# Patient Record
Sex: Female | Born: 1990 | Race: Black or African American | Hispanic: No | Marital: Single | Smoking: Never smoker
Health system: Southern US, Community
[De-identification: ages and names within clinical notes are randomized; demographics above are authoritative.]

## PROBLEM LIST (undated history)

## (undated) DIAGNOSIS — D573 Sickle-cell trait: Secondary | ICD-10-CM

## (undated) DIAGNOSIS — I10 Essential (primary) hypertension: Secondary | ICD-10-CM

## (undated) DIAGNOSIS — G4730 Sleep apnea, unspecified: Secondary | ICD-10-CM

## (undated) HISTORY — DX: Sleep apnea, unspecified: G47.30

## (undated) HISTORY — DX: Sickle-cell trait: D57.3

---

## 2008-07-17 ENCOUNTER — Emergency Department: Payer: Self-pay | Admitting: Emergency Medicine

## 2008-08-07 ENCOUNTER — Emergency Department: Payer: Self-pay | Admitting: Emergency Medicine

## 2009-02-09 ENCOUNTER — Emergency Department: Payer: Self-pay | Admitting: Emergency Medicine

## 2009-04-03 ENCOUNTER — Emergency Department: Payer: Self-pay | Admitting: Emergency Medicine

## 2009-04-28 ENCOUNTER — Encounter: Payer: Self-pay | Admitting: Family Medicine

## 2009-05-13 ENCOUNTER — Encounter: Payer: Self-pay | Admitting: Pediatric Cardiology

## 2009-06-10 ENCOUNTER — Encounter: Payer: Self-pay | Admitting: Pediatric Cardiology

## 2009-06-15 ENCOUNTER — Observation Stay: Payer: Self-pay

## 2009-08-03 ENCOUNTER — Observation Stay: Payer: Self-pay

## 2009-08-13 ENCOUNTER — Inpatient Hospital Stay: Payer: Self-pay

## 2010-03-10 ENCOUNTER — Emergency Department: Payer: Self-pay | Admitting: Emergency Medicine

## 2010-05-03 ENCOUNTER — Emergency Department: Payer: Self-pay | Admitting: Emergency Medicine

## 2010-05-08 ENCOUNTER — Ambulatory Visit: Payer: Self-pay | Admitting: Family Medicine

## 2010-06-08 ENCOUNTER — Encounter: Payer: Self-pay | Admitting: Obstetrics and Gynecology

## 2010-07-13 ENCOUNTER — Encounter: Payer: Self-pay | Admitting: Obstetrics and Gynecology

## 2010-08-08 ENCOUNTER — Observation Stay: Payer: Self-pay

## 2010-08-24 ENCOUNTER — Encounter: Payer: Self-pay | Admitting: Maternal and Fetal Medicine

## 2010-09-14 ENCOUNTER — Observation Stay: Payer: Self-pay | Admitting: Obstetrics and Gynecology

## 2010-11-01 ENCOUNTER — Emergency Department: Payer: Self-pay | Admitting: Emergency Medicine

## 2011-03-14 ENCOUNTER — Emergency Department: Payer: Self-pay | Admitting: Unknown Physician Specialty

## 2011-04-02 ENCOUNTER — Emergency Department: Payer: Self-pay | Admitting: Emergency Medicine

## 2011-04-19 ENCOUNTER — Emergency Department: Payer: Self-pay | Admitting: Unknown Physician Specialty

## 2011-04-22 ENCOUNTER — Emergency Department: Payer: Self-pay | Admitting: Emergency Medicine

## 2011-07-06 ENCOUNTER — Emergency Department: Payer: Self-pay | Admitting: Emergency Medicine

## 2011-07-07 ENCOUNTER — Encounter: Payer: Self-pay | Admitting: Primary Care

## 2011-07-08 ENCOUNTER — Encounter: Payer: Self-pay | Admitting: Primary Care

## 2011-07-30 ENCOUNTER — Emergency Department: Payer: Self-pay

## 2011-08-07 ENCOUNTER — Encounter: Payer: Self-pay | Admitting: Primary Care

## 2011-09-01 ENCOUNTER — Emergency Department: Payer: Self-pay | Admitting: Emergency Medicine

## 2011-12-04 ENCOUNTER — Emergency Department: Payer: Self-pay | Admitting: Emergency Medicine

## 2012-08-01 ENCOUNTER — Emergency Department: Payer: Self-pay | Admitting: Emergency Medicine

## 2012-08-06 LAB — WOUND CULTURE

## 2012-08-18 ENCOUNTER — Emergency Department: Payer: Self-pay | Admitting: Emergency Medicine

## 2012-12-19 ENCOUNTER — Emergency Department: Payer: Self-pay | Admitting: Emergency Medicine

## 2012-12-19 LAB — LIPASE, BLOOD: Lipase: 126 U/L (ref 73–393)

## 2012-12-19 LAB — URINALYSIS, COMPLETE
Bacteria: NONE SEEN
Leukocyte Esterase: NEGATIVE
Nitrite: NEGATIVE
Ph: 8 (ref 4.5–8.0)
RBC,UR: 1 /HPF (ref 0–5)
Squamous Epithelial: 2
WBC UR: 2 /HPF (ref 0–5)

## 2012-12-19 LAB — COMPREHENSIVE METABOLIC PANEL
Albumin: 3.3 g/dL — ABNORMAL LOW (ref 3.4–5.0)
Alkaline Phosphatase: 130 U/L (ref 50–136)
Bilirubin,Total: 0.1 mg/dL — ABNORMAL LOW (ref 0.2–1.0)
Calcium, Total: 8.6 mg/dL (ref 8.5–10.1)
Chloride: 108 mmol/L — ABNORMAL HIGH (ref 98–107)
Co2: 26 mmol/L (ref 21–32)
Creatinine: 0.77 mg/dL (ref 0.60–1.30)
EGFR (Non-African Amer.): >60
Glucose: 100 mg/dL — ABNORMAL HIGH (ref 65–99)
Potassium: 3.8 mmol/L (ref 3.5–5.1)
SGOT(AST): 21 U/L (ref 15–37)
Sodium: 139 mmol/L (ref 136–145)
Total Protein: 7.7 g/dL (ref 6.4–8.2)

## 2012-12-19 LAB — CBC
HGB: 10.9 g/dL — ABNORMAL LOW (ref 12.0–16.0)
MCH: 25 pg — ABNORMAL LOW (ref 26.0–34.0)
MCHC: 33.3 g/dL (ref 32.0–36.0)
MCV: 75 fL — ABNORMAL LOW (ref 80–100)
RBC: 4.34 10*6/uL (ref 3.80–5.20)
RDW: 17.3 % — ABNORMAL HIGH (ref 11.5–14.5)
WBC: 11.6 10*3/uL — ABNORMAL HIGH (ref 3.6–11.0)

## 2013-02-19 ENCOUNTER — Emergency Department: Payer: Self-pay | Admitting: Emergency Medicine

## 2013-02-19 LAB — URINALYSIS, COMPLETE
Bacteria: NONE SEEN
Bilirubin,UR: NEGATIVE
Glucose,UR: NEGATIVE mg/dL (ref 0–75)
Ketone: NEGATIVE
Nitrite: NEGATIVE
Specific Gravity: 1.013 (ref 1.003–1.030)
WBC UR: 20 /HPF (ref 0–5)

## 2013-02-19 LAB — COMPREHENSIVE METABOLIC PANEL
Albumin: 3 g/dL — ABNORMAL LOW (ref 3.4–5.0)
Alkaline Phosphatase: 109 U/L (ref 50–136)
Anion Gap: 4 — ABNORMAL LOW (ref 7–16)
BUN: 8 mg/dL (ref 7–18)
Bilirubin,Total: 0.2 mg/dL (ref 0.2–1.0)
Creatinine: 0.76 mg/dL (ref 0.60–1.30)
EGFR (Non-African Amer.): >60
Osmolality: 280 (ref 275–301)
Potassium: 3.8 mmol/L (ref 3.5–5.1)
SGOT(AST): 19 U/L (ref 15–37)
Sodium: 141 mmol/L (ref 136–145)

## 2013-02-19 LAB — CBC
HCT: 29.8 % — ABNORMAL LOW (ref 35.0–47.0)
HGB: 9.9 g/dL — ABNORMAL LOW (ref 12.0–16.0)
MCH: 25.1 pg — ABNORMAL LOW (ref 26.0–34.0)
MCV: 75 fL — ABNORMAL LOW (ref 80–100)
Platelet: 321 10*3/uL (ref 150–440)
RDW: 18.5 % — ABNORMAL HIGH (ref 11.5–14.5)

## 2013-05-19 ENCOUNTER — Emergency Department: Payer: Self-pay | Admitting: Emergency Medicine

## 2013-07-17 ENCOUNTER — Emergency Department: Payer: Self-pay | Admitting: Emergency Medicine

## 2013-07-17 LAB — URINALYSIS, COMPLETE
Bacteria: NONE SEEN
Glucose,UR: NEGATIVE mg/dL (ref 0–75)
Protein: NEGATIVE
RBC,UR: 17 /HPF (ref 0–5)
Specific Gravity: 1.013 (ref 1.003–1.030)
Squamous Epithelial: 14
WBC UR: 6 /HPF (ref 0–5)

## 2013-07-17 LAB — COMPREHENSIVE METABOLIC PANEL
Albumin: 3.3 g/dL — ABNORMAL LOW (ref 3.4–5.0)
Anion Gap: 4 — ABNORMAL LOW (ref 7–16)
Bilirubin,Total: 0.3 mg/dL (ref 0.2–1.0)
Chloride: 108 mmol/L — ABNORMAL HIGH (ref 98–107)
Co2: 27 mmol/L (ref 21–32)
Creatinine: 0.89 mg/dL (ref 0.60–1.30)
EGFR (African American): >60
EGFR (Non-African Amer.): >60
Glucose: 88 mg/dL (ref 65–99)
Potassium: 4 mmol/L (ref 3.5–5.1)
SGPT (ALT): 18 U/L (ref 12–78)
Sodium: 139 mmol/L (ref 136–145)

## 2013-07-17 LAB — CBC
HCT: 35.1 % (ref 35.0–47.0)
HGB: 11.5 g/dL — ABNORMAL LOW (ref 12.0–16.0)
MCH: 24 pg — ABNORMAL LOW (ref 26.0–34.0)
MCHC: 32.8 g/dL (ref 32.0–36.0)
Platelet: 315 10*3/uL (ref 150–440)
RBC: 4.81 10*6/uL (ref 3.80–5.20)
RDW: 17.7 % — ABNORMAL HIGH (ref 11.5–14.5)

## 2013-07-17 LAB — PREGNANCY, URINE: Pregnancy Test, Urine: NEGATIVE m[IU]/mL

## 2013-07-27 ENCOUNTER — Emergency Department: Payer: Self-pay | Admitting: Internal Medicine

## 2013-10-08 ENCOUNTER — Emergency Department: Payer: Self-pay | Admitting: Emergency Medicine

## 2013-10-09 LAB — URINALYSIS, COMPLETE
Bilirubin,UR: NEGATIVE
Glucose,UR: NEGATIVE mg/dL (ref 0–75)
KETONE: NEGATIVE
Nitrite: NEGATIVE
PH: 6 (ref 4.5–8.0)
Protein: NEGATIVE
SPECIFIC GRAVITY: 1.013 (ref 1.003–1.030)
Squamous Epithelial: 13
WBC UR: 4 /HPF (ref 0–5)

## 2013-10-09 LAB — WET PREP, GENITAL

## 2013-10-09 LAB — GC/CHLAMYDIA PROBE AMP

## 2013-10-10 LAB — URINE CULTURE

## 2013-10-17 ENCOUNTER — Emergency Department: Payer: Self-pay | Admitting: Emergency Medicine

## 2013-10-18 LAB — URINALYSIS, COMPLETE
BILIRUBIN, UR: NEGATIVE
BLOOD: NEGATIVE
Glucose,UR: NEGATIVE mg/dL (ref 0–75)
Ketone: NEGATIVE
NITRITE: NEGATIVE
PROTEIN: NEGATIVE
Ph: 6 (ref 4.5–8.0)
RBC,UR: 3 /HPF (ref 0–5)
SPECIFIC GRAVITY: 1.015 (ref 1.003–1.030)
Squamous Epithelial: 7

## 2013-11-21 ENCOUNTER — Ambulatory Visit: Payer: Self-pay | Admitting: Primary Care

## 2014-06-21 IMAGING — US ABDOMEN ULTRASOUND
1 series · 14 of 25 positions shown · non-contrast
Comparison: None.

CLINICAL DATA: Abdominal pain

EXAM:
ULTRASOUND ABDOMEN COMPLETE

[Series 1: abdomen ultrasound · 0.23mm/px · 14 of 84 slices shown]
[im 1/84]
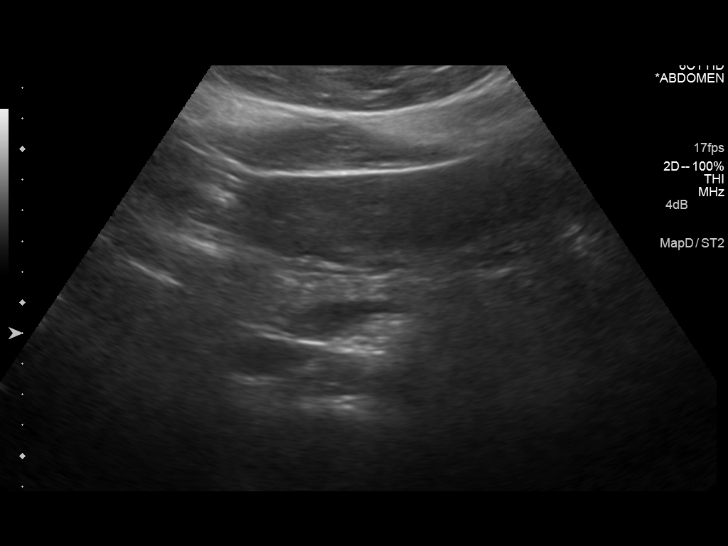
[im 7/84]
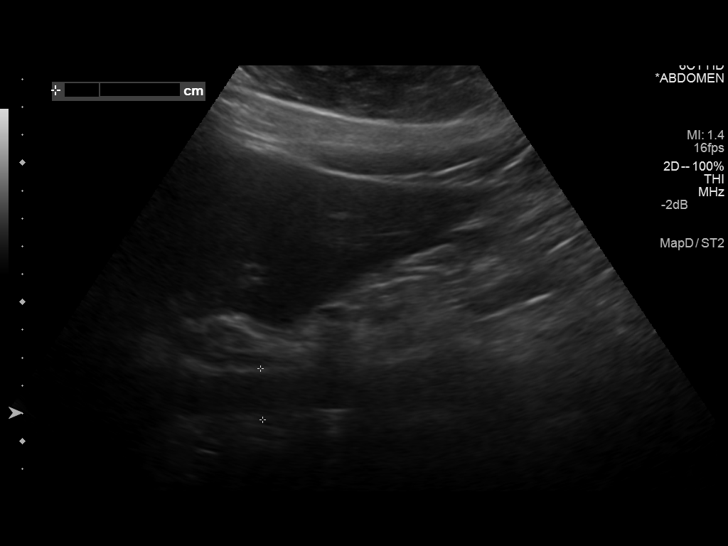
[im 14/84]
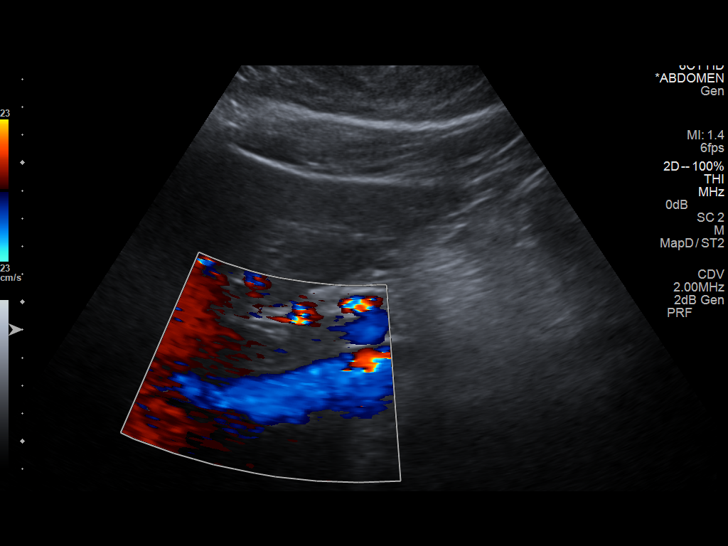
[im 21/84]
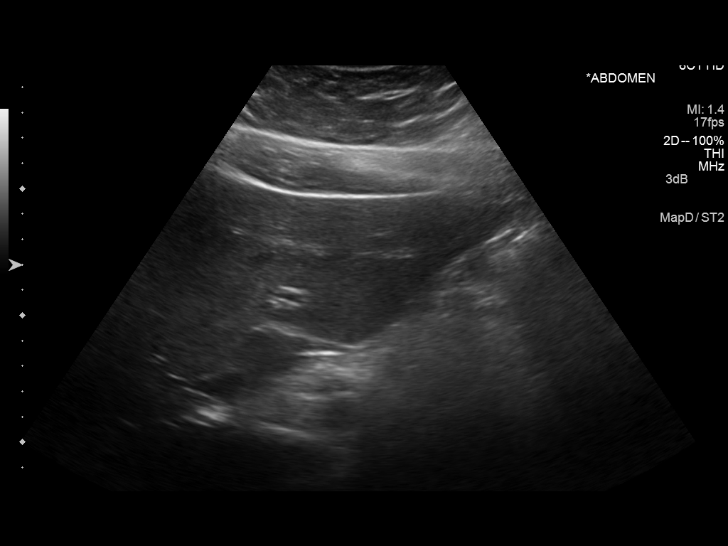
[im 28/84]
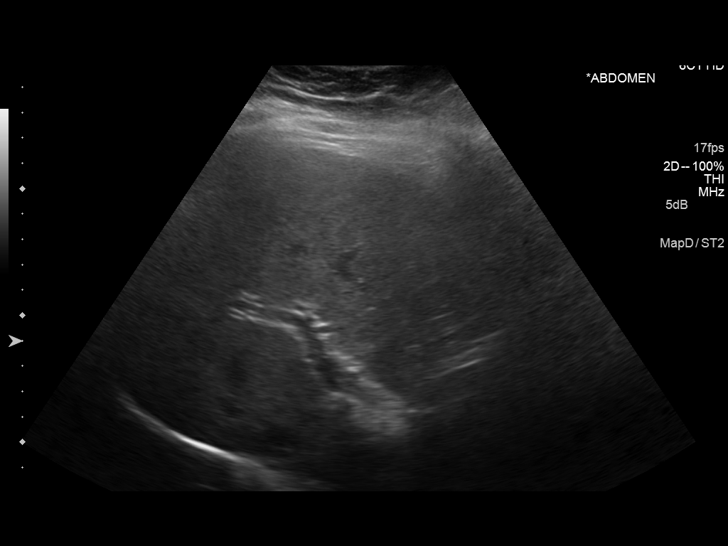
[im 32/84]
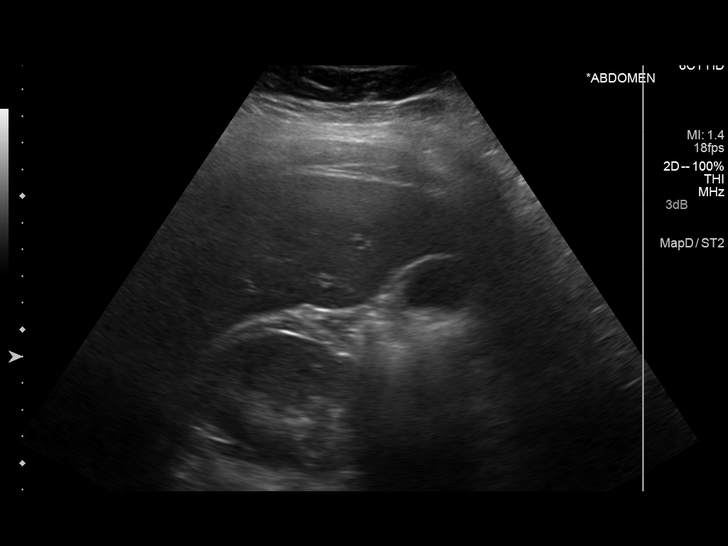
[im 39/84]
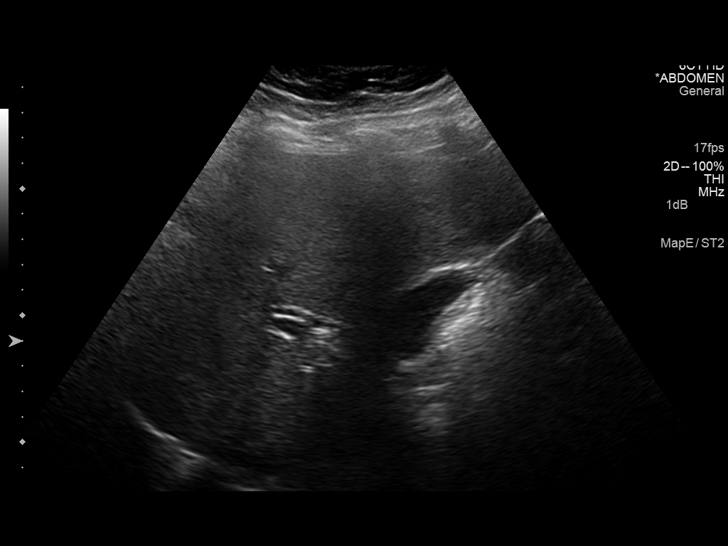
[im 45/84]
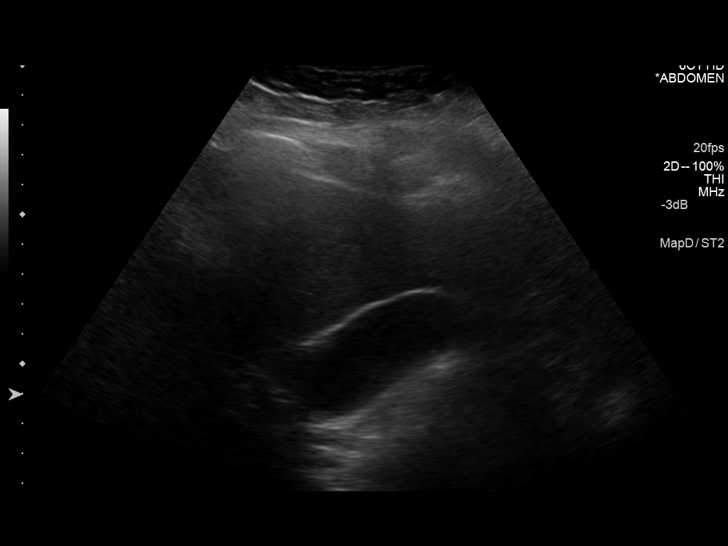
[im 52/84]
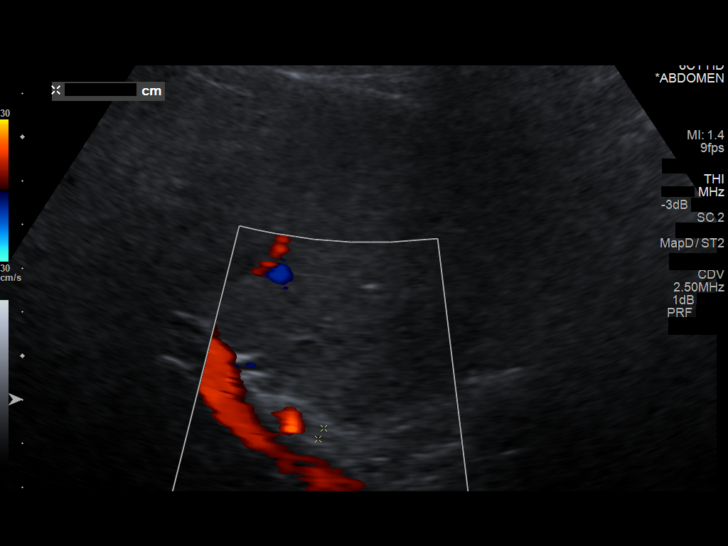
[im 56/84]
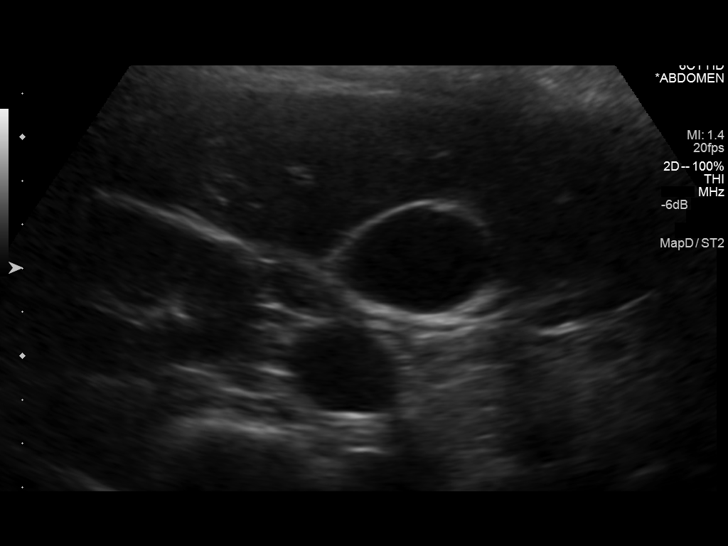
[im 63/84]
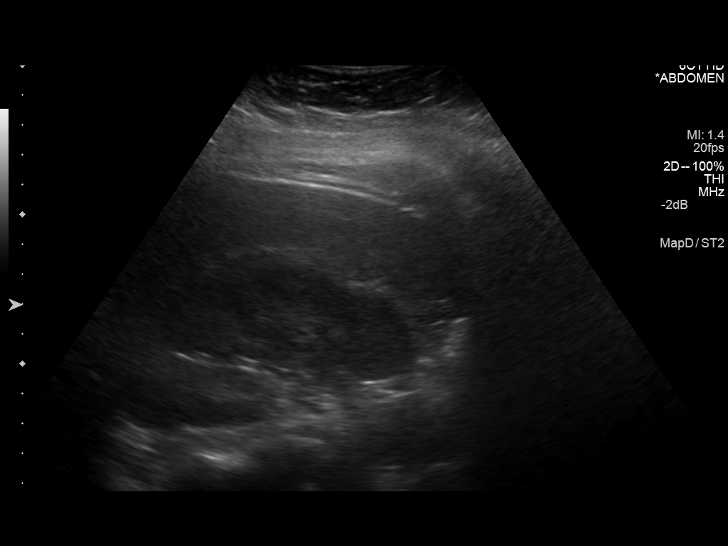
[im 70/84]
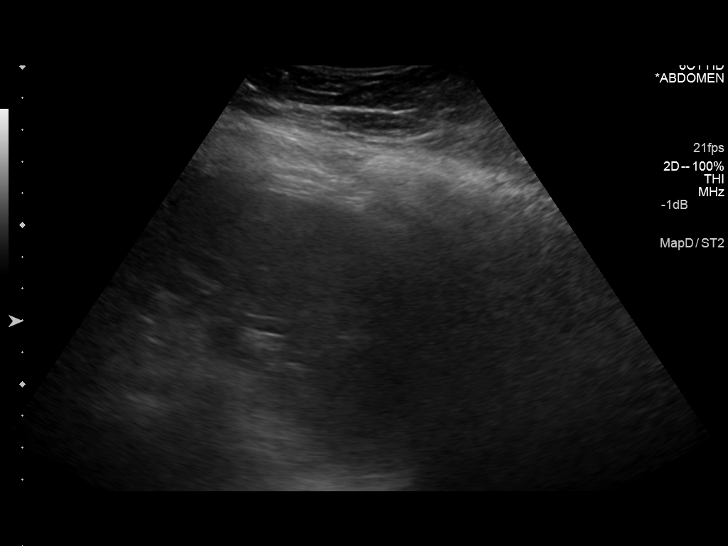
[im 77/84]
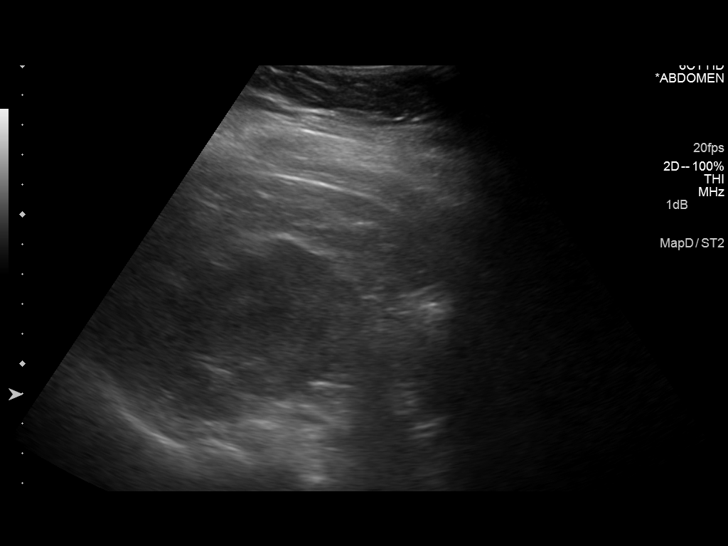
[im 84/84]
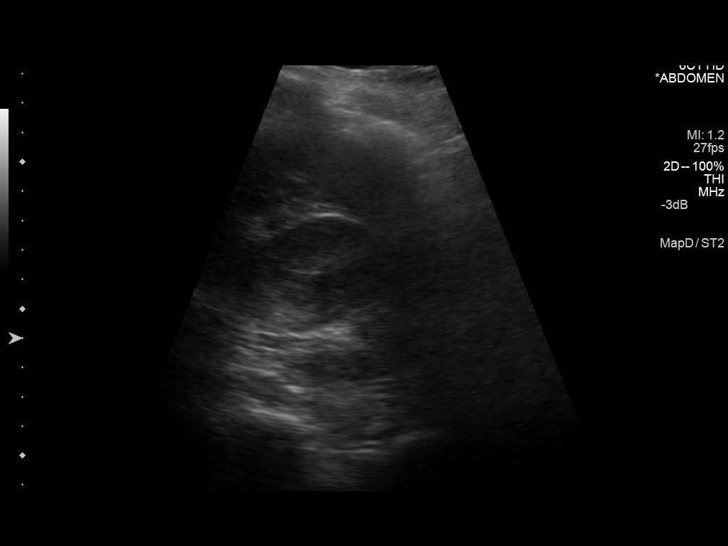

[14 of 25 positions shown; findings below may reference images not displayed]

FINDINGS: Gallbladder:

No gallstones or wall thickening visualized. There is no
pericholecystic fluid. No sonographic Murphy sign noted.

Common bile duct:

Diameter: 3 mm. There is no intrahepatic, common hepatic, or common
bile duct dilatation.

Liver:

No focal lesion identified.  Liver echogenicity is mildly increased.

IVC:

No abnormality visualized.

Pancreas:

No mass or inflammatory focus.

Spleen:

Size and appearance within normal limits.

Right Kidney:

Length: 12.0 cm. Echogenicity within normal limits. No mass or
hydronephrosis visualized.

Left Kidney:

Length: 12.0 cm. Echogenicity within normal limits. No mass or
hydronephrosis visualized.

Abdominal aorta:

No aneurysm visualized.

Other findings:

No demonstrable ascites.
IMPRESSION: Increased liver echogenicity consistent with fatty change. While no
focal liver lesions are identified, it must be cautioned that the
sensitivity of ultrasound for focal liver lesions is diminished
given fatty change. Study otherwise unremarkable.

## 2014-06-27 ENCOUNTER — Emergency Department: Payer: Self-pay | Admitting: Emergency Medicine

## 2014-06-27 LAB — COMPREHENSIVE METABOLIC PANEL
ALBUMIN: 3.2 g/dL — AB (ref 3.4–5.0)
Alkaline Phosphatase: 124 U/L — ABNORMAL HIGH
Anion Gap: 7 (ref 7–16)
BUN: 11 mg/dL (ref 7–18)
Bilirubin,Total: 0.2 mg/dL (ref 0.2–1.0)
CALCIUM: 8.7 mg/dL (ref 8.5–10.1)
CO2: 25 mmol/L (ref 21–32)
Chloride: 105 mmol/L (ref 98–107)
Creatinine: 0.81 mg/dL (ref 0.60–1.30)
EGFR (African American): >60
GLUCOSE: 115 mg/dL — AB (ref 65–99)
Osmolality: 274 (ref 275–301)
Potassium: 3.8 mmol/L (ref 3.5–5.1)
SGOT(AST): 15 U/L (ref 15–37)
SGPT (ALT): 18 U/L
Sodium: 137 mmol/L (ref 136–145)
TOTAL PROTEIN: 7.7 g/dL (ref 6.4–8.2)

## 2014-06-27 LAB — CBC WITH DIFFERENTIAL/PLATELET
BASOS ABS: 0.1 10*3/uL (ref 0.0–0.1)
BASOS PCT: 1.3 %
EOS ABS: 0.1 10*3/uL (ref 0.0–0.7)
EOS PCT: 1.1 %
HCT: 37.2 % (ref 35.0–47.0)
HGB: 11.6 g/dL — AB (ref 12.0–16.0)
LYMPHS ABS: 2.7 10*3/uL (ref 1.0–3.6)
Lymphocyte %: 33.5 %
MCH: 23.3 pg — ABNORMAL LOW (ref 26.0–34.0)
MCHC: 31.1 g/dL — ABNORMAL LOW (ref 32.0–36.0)
MCV: 75 fL — ABNORMAL LOW (ref 80–100)
Monocyte #: 0.4 x10 3/mm (ref 0.2–0.9)
Monocyte %: 5.5 %
Neutrophil #: 4.7 10*3/uL (ref 1.4–6.5)
Neutrophil %: 58.6 %
PLATELETS: 313 10*3/uL (ref 150–440)
RBC: 4.98 10*6/uL (ref 3.80–5.20)
RDW: 17.9 % — ABNORMAL HIGH (ref 11.5–14.5)
WBC: 8.1 10*3/uL (ref 3.6–11.0)

## 2014-07-21 ENCOUNTER — Emergency Department: Payer: Self-pay | Admitting: Emergency Medicine

## 2014-11-09 ENCOUNTER — Emergency Department: Payer: Self-pay | Admitting: Emergency Medicine

## 2015-02-01 ENCOUNTER — Encounter: Payer: Self-pay | Admitting: Emergency Medicine

## 2015-02-01 ENCOUNTER — Emergency Department
Admission: EM | Admit: 2015-02-01 | Discharge: 2015-02-01 | Disposition: A | Discharge status: Home / Self Care | Payer: Medicaid Other | Attending: Emergency Medicine | Admitting: Emergency Medicine

## 2015-02-01 DIAGNOSIS — N390 Urinary tract infection, site not specified: Secondary | ICD-10-CM | POA: Insufficient documentation

## 2015-02-01 DIAGNOSIS — I10 Essential (primary) hypertension: Secondary | ICD-10-CM | POA: Insufficient documentation

## 2015-02-01 DIAGNOSIS — B373 Candidiasis of vulva and vagina: Secondary | ICD-10-CM | POA: Diagnosis not present

## 2015-02-01 DIAGNOSIS — Z3202 Encounter for pregnancy test, result negative: Secondary | ICD-10-CM | POA: Diagnosis not present

## 2015-02-01 DIAGNOSIS — B3731 Acute candidiasis of vulva and vagina: Secondary | ICD-10-CM

## 2015-02-01 DIAGNOSIS — L299 Pruritus, unspecified: Secondary | ICD-10-CM | POA: Diagnosis present

## 2015-02-01 HISTORY — DX: Essential (primary) hypertension: I10

## 2015-02-01 LAB — URINALYSIS COMPLETE WITH MICROSCOPIC (ARMC ONLY)
Bilirubin Urine: NEGATIVE
Glucose, UA: NEGATIVE mg/dL
HGB URINE DIPSTICK: NEGATIVE
Ketones, ur: NEGATIVE mg/dL
Nitrite: NEGATIVE
PH: 7 (ref 5.0–8.0)
Protein, ur: NEGATIVE mg/dL
Specific Gravity, Urine: 1.012 (ref 1.005–1.030)

## 2015-02-01 LAB — WET PREP, GENITAL
Clue Cells Wet Prep HPF POC: NONE SEEN
Trich, Wet Prep: NONE SEEN
YEAST WET PREP: NONE SEEN

## 2015-02-01 LAB — CHLAMYDIA/NGC RT PCR (ARMC ONLY)
Chlamydia Tr: NOT DETECTED
N GONORRHOEAE: NOT DETECTED

## 2015-02-01 LAB — POCT PREGNANCY, URINE: PREG TEST UR: NEGATIVE

## 2015-02-01 MED ORDER — NITROFURANTOIN MONOHYD MACRO 100 MG PO CAPS: 100.0000 mg | ORAL_CAPSULE | Freq: Two times a day (BID) | ORAL | Status: DC

## 2015-02-01 MED ORDER — FLUCONAZOLE 150 MG PO TABS: 150.0000 mg | ORAL_TABLET | Freq: Once | ORAL | Status: DC

## 2015-02-01 NOTE — Discharge Instructions (Signed)
Asymptomatic Bacteriuria Asymptomatic bacteriuria is the presence of a large number of bacteria in your urine without the usual symptoms of burning or frequent urination. The following conditions increase the risk of asymptomatic bacteriuria:  Diabetes mellitus.  Advanced age.  Pregnancy in the first trimester.  Kidney stones.  Kidney transplants.  Leaky kidney tube valve in young children (reflux). Treatment for this condition is not needed in most people and can lead to other problems such as too much yeast and growth of resistant bacteria. However, some people, such as pregnant women, do need treatment to prevent kidney infection. Asymptomatic bacteriuria in pregnancy is also associated with fetal growth restriction, premature labor, and newborn death. HOME CARE INSTRUCTIONS Monitor your condition for any changes. The following actions may help to relieve any discomfort you are feeling:  Drink enough water and fluids to keep your urine clear or pale yellow. Go to the bathroom more often to keep your bladder empty.  Keep the area around your vagina and rectum clean. Wipe yourself from front to back after urinating. SEEK IMMEDIATE MEDICAL CARE IF:  You develop signs of an infection such as:  Burning with urination.  Frequency of voiding.  Back pain.  Fever.  You have blood in the urine.  You develop a fever. MAKE SURE YOU:  Understand these instructions.  Will watch your condition.  Will get help right away if you are not doing well or get worse. Document Released: 08/23/2005 Document Revised: 01/07/2014 Document Reviewed: 02/12/2013 [facility] Patient Information 2015 [place], [place]. This information is not intended to replace advice given to you by your health care provider. Make sure you discuss any questions you have with your health care provider.  Candidal Vulvovaginitis Candidal vulvovaginitis is an infection of the vagina and vulva. The vulva is the skin around  the opening of the vagina. This may cause itching and discomfort in and around the vagina.  HOME CARE  Only take medicine as told by your doctor.  Do not have sex (intercourse) until the infection is healed or as told by your doctor.  Practice safe sex.  Tell your sex partner about your infection.  Do not douche or use tampons.  Wear cotton underwear. Do not wear tight pants or panty hose.  Eat yogurt. This may help treat and prevent yeast infections. GET HELP RIGHT AWAY IF:   You have a fever.  Your problems get worse during treatment or do not get better in 3 days.  You have discomfort, irritation, or itching in your vagina or vulva area.  You have pain after sex.  You start to get belly (abdominal) pain. MAKE SURE YOU:  Understand these instructions.  Will watch your condition.  Will get help right away if you are not doing well or get worse. Document Released: 11/19/2008 Document Revised: 08/28/2013 Document Reviewed: 11/19/2008 [facility] Patient Information 2015 [place], [place]. This information is not intended to replace advice given to you by your health care provider. Make sure you discuss any questions you have with your health care provider.  Take the prescription meds as directed.  Follow-up with your provider for continued symptoms.

## 2015-02-01 NOTE — ED Provider Notes (Signed)
[facility] Emergency Department Provider Note ____________________________________________  Time seen: 1227  I have reviewed the triage vital signs and the nursing notes.  HISTORY  Chief Complaint Vaginal Itching  HPI Vanessa Conner is a 24 y.o. female who reports to the ED with onset of vaginal is itching status post intercourse yesterday. She denies any significant vaginal discharge, dysuria, or hematuria. He does use topical powder and a vinegar wash without resolution of her symptoms.She denies fever, chills, sweats, nausea, vomiting or diarrhea.  Past Medical History  Diagnosis Date  . Hypertension    There are no active problems to display for this patient.  No past surgical history on file.  Current Outpatient Rx  Name  Route  Sig  Dispense  Refill  . fluconazole (DIFLUCAN) 150 MG tablet   Oral   Take 1 tablet (150 mg total) by mouth once.   2 tablet   0     Dose 1 tab now.  Then repeat in 1 week after antib ...   . nitrofurantoin, macrocrystal-monohydrate, (MACROBID) 100 MG capsule   Oral   Take 1 capsule (100 mg total) by mouth 2 (two) times daily.   14 capsule   0    Allergies Review of patient's allergies indicates no known allergies.  No family history on file.  Social History History  Substance Use Topics  . Smoking status: Never Smoker   . Smokeless tobacco: Not on file  . Alcohol Use: 0.6 oz/week    1 Standard drinks or equivalent per week   Review of Systems  Constitutional: Negative for fever. Eyes: Negative for visual changes. ENT: Negative for sore throat. Cardiovascular: Negative for chest pain. Respiratory: Negative for shortness of breath. Gastrointestinal: Negative for abdominal pain, vomiting and diarrhea. Genitourinary: Negative for dysuria. Positive for vulvar itching. Positive for discharge.  Musculoskeletal: Negative for back pain. Skin: Negative for rash. Neurological: Negative for headaches, focal  weakness or numbness. ____________________________________________  PHYSICAL EXAM:  VITAL SIGNS: ED Triage Vitals  Enc Vitals Group     BP 02/01/15 1135 139/83 mmHg     Pulse Rate 02/01/15 1135 84     Resp 02/01/15 1135 18     Temp 02/01/15 1135 98.1 F (36.7 C)     Temp Source 02/01/15 1135 Oral     SpO2 02/01/15 1135 100 %     Weight 02/01/15 1135 342 lb (155.13 kg)     Height 02/01/15 1135  (1.676 m)     Head Cir --      Peak Flow --      Pain Score 02/01/15 1136 0     Pain Loc --      Pain Edu? --      Excl. in GC? --    Constitutional: Alert and oriented. Well appearing and in no distress. Obese female appearing stated age.  HEENT: Normocephalic and atraumatic. Conjunctivae are normal. PERRL. Normal extraocular movements. Respiratory: Normal respiratory effort Musculoskeletal: Nontender with normal range of motion in all extremities. Genitourinary: Normal external exam. Vaginal exam shows moderate white discharge. Cervix not located due to body habitus and discomfort with prolonged speculum use.  Neurologic:  Normal speech and language. No gross focal neurologic deficits are appreciated. Skin:  Skin is warm, dry and intact. No rash noted. Psychiatric: Mood and affect are normal. Patient exhibits appropriate insight and judgment. ____________________________________________    LABS  Labs Reviewed  WET PREP, GENITAL - Abnormal; Notable for the following:  WBC, Wet Prep HPF POC MODERATE (*)    All other components within normal limits  URINALYSIS COMPLETEWITH MICROSCOPIC ([facility] ONLY) - Abnormal; Notable for the following:    Color, Urine YELLOW (*)    APPearance CLOUDY (*)    Leukocytes, UA 3+ (*)    Bacteria, UA RARE (*)    Squamous Epithelial / LPF 6-30 (*)    All other components within normal limits  CHLAMYDIA/NGC RT PCR ([facility] ONLY)  POC URINE PREG, ED  POCT PREGNANCY, URINE  __________________________________________  INITIAL IMPRESSION /  ASSESSMENT AND PLAN / ED COURSE  Uncomplicated UTI and vulvovaginitis likely due to candida.  Treatment with Macrobid and Diflucan.  Follow-up with primary care provider for continued symptoms. ____________________________________________  FINAL CLINICAL IMPRESSION(S) / ED DIAGNOSES  Final diagnoses:  UTI (lower urinary tract infection)  Vulvar candidiasis     Lissa HoardJenise V Bacon Scotty Pinder, PA-C 02/01/15 1513  Minna AntisKevin Paduchowski, MD 02/02/15 1248

## 2015-02-01 NOTE — ED Notes (Signed)
No discharge

## 2015-09-05 ENCOUNTER — Emergency Department: Payer: Medicaid Other

## 2015-09-05 ENCOUNTER — Emergency Department
Admission: EM | Admit: 2015-09-05 | Discharge: 2015-09-05 | Disposition: A | Discharge status: Home / Self Care | Payer: Medicaid Other | Attending: Emergency Medicine | Admitting: Emergency Medicine

## 2015-09-05 DIAGNOSIS — K5900 Constipation, unspecified: Secondary | ICD-10-CM | POA: Diagnosis not present

## 2015-09-05 DIAGNOSIS — I10 Essential (primary) hypertension: Secondary | ICD-10-CM | POA: Insufficient documentation

## 2015-09-05 DIAGNOSIS — Z792 Long term (current) use of antibiotics: Secondary | ICD-10-CM | POA: Diagnosis not present

## 2015-09-05 DIAGNOSIS — R1030 Lower abdominal pain, unspecified: Secondary | ICD-10-CM | POA: Diagnosis present

## 2015-09-05 DIAGNOSIS — Z3202 Encounter for pregnancy test, result negative: Secondary | ICD-10-CM | POA: Diagnosis not present

## 2015-09-05 LAB — URINALYSIS COMPLETE WITH MICROSCOPIC (ARMC ONLY)
BILIRUBIN URINE: NEGATIVE
GLUCOSE, UA: NEGATIVE mg/dL
Hgb urine dipstick: NEGATIVE
Ketones, ur: NEGATIVE mg/dL
Leukocytes, UA: NEGATIVE
Nitrite: NEGATIVE
Protein, ur: NEGATIVE mg/dL
Specific Gravity, Urine: 1.014 (ref 1.005–1.030)
pH: 6 (ref 5.0–8.0)

## 2015-09-05 LAB — POCT PREGNANCY, URINE: PREG TEST UR: NEGATIVE

## 2015-09-05 MED ORDER — LACTULOSE 10 GM/15ML PO SOLN: 20.0000 g | Freq: Every day | ORAL | Status: DC | PRN

## 2015-09-05 MED ORDER — KETOROLAC TROMETHAMINE 30 MG/ML IJ SOLN
30.0000 mg | Freq: Once | INTRAMUSCULAR | Status: AC
  Administered 2015-09-05: 30 mg via INTRAVENOUS
  Filled 2015-09-05: qty 1

## 2015-09-05 NOTE — Discharge Instructions (Signed)
1. You may take laxative as needed (Lactulose). 2. Drink plenty of fluids daily. 3. Return to the ER for worsening symptoms, persistent vomiting, difficulty breathing or other concerns.  Abdominal Pain, Adult Many things can cause abdominal pain. Usually, abdominal pain is not caused by a disease and will improve without treatment. It can often be observed and treated at home. Your health care provider will do a physical exam and possibly order blood tests and X-rays to help determine the seriousness of your pain. However, in many cases, more time must pass before a clear cause of the pain can be found. Before that point, your health care provider may not know if you need more testing or further treatment. HOME CARE INSTRUCTIONS Monitor your abdominal pain for any changes. The following actions may help to alleviate any discomfort you are experiencing:  Only take over-the-counter or prescription medicines as directed by your health care provider.  Do not take laxatives unless directed to do so by your health care provider.  Try a clear liquid diet (broth, tea, or water) as directed by your health care provider. Slowly move to a bland diet as tolerated. SEEK MEDICAL CARE IF:  You have unexplained abdominal pain.  You have abdominal pain associated with nausea or diarrhea.  You have pain when you urinate or have a bowel movement.  You experience abdominal pain that wakes you in the night.  You have abdominal pain that is worsened or improved by eating food.  You have abdominal pain that is worsened with eating fatty foods.  You have a fever. SEEK IMMEDIATE MEDICAL CARE IF:  Your pain does not go away within 2 hours.  You keep throwing up (vomiting).  Your pain is felt only in portions of the abdomen, such as the right side or the left lower portion of the abdomen.  You pass bloody or black tarry stools. MAKE SURE YOU:  Understand these instructions.  Will watch your  condition.  Will get help right away if you are not doing well or get worse.   This information is not intended to replace advice given to you by your health care provider. Make sure you discuss any questions you have with your health care provider.   Document Released: 06/02/2005 Document Revised: 05/14/2015 Document Reviewed: 05/02/2013 Elsevier Interactive Patient Education 2016 [employer].  Constipation, Adult Constipation is when a person has fewer than three bowel movements a week, has difficulty having a bowel movement, or has stools that are dry, hard, or larger than normal. As people grow older, constipation is more common. A low-fiber diet, not taking in enough fluids, and taking certain medicines may make constipation worse.  CAUSES   Certain medicines, such as antidepressants, pain medicine, iron supplements, antacids, and water pills.   Certain diseases, such as diabetes, irritable bowel syndrome (IBS), thyroid disease, or depression.   Not drinking enough water.   Not eating enough fiber-rich foods.   Stress or travel.   Lack of physical activity or exercise.   Ignoring the urge to have a bowel movement.   Using laxatives too much.  SIGNS AND SYMPTOMS   Having fewer than three bowel movements a week.   Straining to have a bowel movement.   Having stools that are hard, dry, or larger than normal.   Feeling full or bloated.   Pain in the lower abdomen.   Not feeling relief after having a bowel movement.  DIAGNOSIS  Your health care provider will take a medical  history and perform a physical exam. Further testing may be done for severe constipation. Some tests may include:  A barium enema X-ray to examine your rectum, colon, and, sometimes, your small intestine.   A sigmoidoscopy to examine your lower colon.   A colonoscopy to examine your entire colon. TREATMENT  Treatment will depend on the severity of your constipation and what is  causing it. Some dietary treatments include drinking more fluids and eating more fiber-rich foods. Lifestyle treatments may include regular exercise. If these diet and lifestyle recommendations do not help, your health care provider may recommend taking over-the-counter laxative medicines to help you have bowel movements. Prescription medicines may be prescribed if over-the-counter medicines do not work.  HOME CARE INSTRUCTIONS   Eat foods that have a lot of fiber, such as fruits, vegetables, whole grains, and beans.  Limit foods high in fat and processed sugars, such as french fries, hamburgers, cookies, candies, and soda.   A fiber supplement may be added to your diet if you cannot get enough fiber from foods.   Drink enough fluids to keep your urine clear or pale yellow.   Exercise regularly or as directed by your health care provider.   Go to the restroom when you have the urge to go. Do not hold it.   Only take over-the-counter or prescription medicines as directed by your health care provider. Do not take other medicines for constipation without talking to your health care provider first.  SEEK IMMEDIATE MEDICAL CARE IF:   You have bright red blood in your stool.   Your constipation lasts for more than 4 days or gets worse.   You have abdominal or rectal pain.   You have thin, pencil-like stools.   You have unexplained weight loss. MAKE SURE YOU:   Understand these instructions.  Will watch your condition.  Will get help right away if you are not doing well or get worse.   This information is not intended to replace advice given to you by your health care provider. Make sure you discuss any questions you have with your health care provider.   Document Released: 05/21/2004 Document Revised: 09/13/2014 Document Reviewed: 06/04/2013 Elsevier Interactive Patient Education [employer] Elsevier Inc.

## 2015-09-05 NOTE — ED Provider Notes (Signed)
[facility] Emergency Department Provider Note  ____________________________________________  Time seen: Approximately 4:09 AM  I have reviewed the triage vital signs and the nursing notes.   HISTORY  Chief Complaint Abdominal Pain    HPI Vanessa Conner is a 24 y.o. female who presents to the ED from home with a chief complaint of abdominal pain. Patient complains of a 2 day history of low abdominal pain. Describes sharp, nonradiating pain in her central low abdomen. Thought she had a UTI or she needed to drink some water. Denies associated symptoms of fever, chills, chest pain, shortness of breath, nausea, vomiting, diarrhea, dysuria. Does complain of constipation. She usually has a bowel movement daily but has not had one for 2 days. Denies vaginal discharge or bleeding. Denies STD exposure. Denies prior history of ovarian cysts, endometriosis or nephrolithiasis. Nothing makes her symptoms better or worse. She was unable to sleep tonight secondary to discomfort and "wanted to be checked out".   Past Medical History  Diagnosis Date  . Hypertension     There are no active problems to display for this patient.   History reviewed. No pertinent past surgical history.  Current Outpatient Rx  Name  Route  Sig  Dispense  Refill  . fluconazole (DIFLUCAN) 150 MG tablet   Oral   Take 1 tablet (150 mg total) by mouth once.   2 tablet   0     Dose 1 tab now.  Then repeat in 1 week after antib ...   . nitrofurantoin, macrocrystal-monohydrate, (MACROBID) 100 MG capsule   Oral   Take 1 capsule (100 mg total) by mouth 2 (two) times daily.   14 capsule   0     Allergies Review of patient's allergies indicates no known allergies.  No family history on file.  Social History Social History  Substance Use Topics  . Smoking status: Never Smoker   . Smokeless tobacco: None  . Alcohol Use: No    Review of Systems Constitutional: No fever/chills Eyes:  No visual changes. ENT: No sore throat. Cardiovascular: Denies chest pain. Respiratory: Denies shortness of breath. Gastrointestinal: Positive for abdominal pain.  No nausea, no vomiting.  No diarrhea.  Positive for constipation. Genitourinary: Negative for dysuria. Musculoskeletal: Negative for back pain. Skin: Negative for rash. Neurological: Negative for headaches, focal weakness or numbness.  10-point ROS otherwise negative.  ____________________________________________   PHYSICAL EXAM:  VITAL SIGNS: ED Triage Vitals  Enc Vitals Group     BP 09/05/15 0226 148/94 mmHg     Pulse Rate 09/05/15 0226 95     Resp 09/05/15 0226 20     Temp 09/05/15 0226 98.6 F (37 C)     Temp Source 09/05/15 0226 Oral     SpO2 09/05/15 0226 100 %     Weight 09/05/15 0226 331 lb (150.141 kg)     Height 09/05/15 0226 5\' 7"  (1.702 m)     Head Cir --      Peak Flow --      Pain Score 09/05/15 0230 7     Pain Loc --      Pain Edu? --      Excl. in GC? --     Constitutional: Alert and oriented. Well appearing and in no acute distress. Eyes: Conjunctivae are normal. PERRL. EOMI. Head: Atraumatic. Nose: No congestion/rhinnorhea. Mouth/Throat: Mucous membranes are moist.  Oropharynx non-erythematous. Neck: No stridor.   Cardiovascular: Normal rate, regular rhythm. Grossly normal heart sounds.  Good  peripheral circulation. Respiratory: Normal respiratory effort.  No retractions. Lungs CTAB. Gastrointestinal: Obese. Soft and minimally tender to palpation suprapubic region without rebound or guarding. No tenderness to either lower quadrant. No distention. No abdominal bruits. No CVA tenderness. Musculoskeletal: No lower extremity tenderness nor edema.  No joint effusions. Neurologic:  Normal speech and language. No gross focal neurologic deficits are appreciated. No gait instability. Skin:  Skin is warm, dry and intact. No rash noted. Psychiatric: Mood and affect are normal. Speech and behavior are  normal.  ____________________________________________   LABS (all labs ordered are listed, but only abnormal results are displayed)  Labs Reviewed  URINALYSIS COMPLETEWITH MICROSCOPIC ([facility] ONLY) - Abnormal; Notable for the following:    Color, Urine YELLOW (*)    APPearance HAZY (*)    Bacteria, UA RARE (*)    Squamous Epithelial / LPF 6-30 (*)    All other components within normal limits  POC URINE PREG, ED  POCT PREGNANCY, URINE   ____________________________________________  EKG  None ____________________________________________  RADIOLOGY  KUB (viewed by me, interpreted per Dr. Cherly Hensen): Unremarkable bowel gas pattern; no free intra-abdominal air seen. Moderate amount of stool noted in the colon. ____________________________________________   PROCEDURES  Procedure(s) performed: None  Critical Care performed: No  ____________________________________________   INITIAL IMPRESSION / ASSESSMENT AND PLAN / ED COURSE  Pertinent labs & imaging results that were available during my care of the patient were reviewed by me and considered in my medical decision making (see chart for details).  24 year old female who presents with suprapubic discomfort and constipation 2 days. Will check urinalysis, KUB and reassess.  ----------------------------------------- 5:18 AM on 09/05/2015 -----------------------------------------  Patient is resting comfortably, testing on her cell phone. Updated patient of urinalysis and KUB results. Will place on lactulose. Very low suspicion for ovarian pathology such as torsion given that patient does not have adnexal tenderness on either side. Strict return precautions given. Patient verbalizes understanding and agrees with plan of care. ____________________________________________   FINAL CLINICAL IMPRESSION(S) / ED DIAGNOSES  Final diagnoses:  Lower abdominal pain  Constipation, unspecified constipation type      Irean Hong,  MD 09/05/15 [phone]

## 2015-09-05 NOTE — ED Notes (Signed)
Patient states lower abdominal pain two hours ago. Thought she had a UTI or that she needed to drink some water. THought she would come and get it checked.

## 2016-05-11 ENCOUNTER — Encounter: Payer: Self-pay | Admitting: Emergency Medicine

## 2016-05-11 ENCOUNTER — Emergency Department
Admission: EM | Admit: 2016-05-11 | Discharge: 2016-05-11 | Disposition: A | Discharge status: Home / Self Care | Payer: Medicaid Other | Attending: Emergency Medicine | Admitting: Emergency Medicine

## 2016-05-11 DIAGNOSIS — J020 Streptococcal pharyngitis: Secondary | ICD-10-CM

## 2016-05-11 DIAGNOSIS — J029 Acute pharyngitis, unspecified: Secondary | ICD-10-CM | POA: Diagnosis present

## 2016-05-11 DIAGNOSIS — Z79899 Other long term (current) drug therapy: Secondary | ICD-10-CM | POA: Diagnosis not present

## 2016-05-11 DIAGNOSIS — I10 Essential (primary) hypertension: Secondary | ICD-10-CM | POA: Insufficient documentation

## 2016-05-11 LAB — POCT RAPID STREP A: STREPTOCOCCUS, GROUP A SCREEN (DIRECT): POSITIVE — AB

## 2016-05-11 MED ORDER — IBUPROFEN 600 MG PO TABS: 600.0000 mg | ORAL_TABLET | Freq: Four times a day (QID) | ORAL | 0 refills | Status: DC | PRN

## 2016-05-11 MED ORDER — LIDOCAINE VISCOUS 2 % MT SOLN: 10.0000 mL | OROMUCOSAL | 0 refills | Status: DC | PRN

## 2016-05-11 MED ORDER — AMOXICILLIN 875 MG PO TABS: 875.0000 mg | ORAL_TABLET | Freq: Two times a day (BID) | ORAL | 0 refills | Status: DC

## 2016-05-11 NOTE — ED Notes (Signed)
Sore throat that began today. Pain with swallowing. Pt alert and oriented X4, active, cooperative, pt in NAD. RR even and unlabored, color WNL.

## 2016-05-11 NOTE — ED Provider Notes (Signed)
[name] Emergency Department Provider Note  ____________________________________________  Time seen: Approximately 1:45 PM  I have reviewed the triage vital signs and the nursing notes.   HISTORY  Chief Complaint Sore Throat    HPI Vanessa Conner is a 25 y.o. female , NAD, presents to the emergency department with several hour history of sore throat. Patient states she had sudden onset of sore throat and left-sided neck pain. Has not had any fevers or chills but has felt fatigued today. Has had some mild nasal congestion and runny nose but no sinus pressure, ear pain or drainage. Denies any abdominal pain, nausea, vomiting, rash. States that she and her children were sick approximately 2 weeks ago with upper respiratory infections but nothing recently. Has not taken anything over-the-counter for her symptoms at this time.   Past Medical History:  Diagnosis Date  . Hypertension     There are no active problems to display for this patient.   No past surgical history on file.  Prior to Admission medications   Medication Sig Start Date End Date Taking? Authorizing Provider  amoxicillin (AMOXIL) 875 MG tablet Take 1 tablet (875 mg total) by mouth 2 (two) times daily. 05/11/16   Montoya Brandel L Ferrell [employer], PA-C  fluconazole (DIFLUCAN) 150 MG tablet Take 1 tablet (150 mg total) by mouth once. 02/01/15   Jenise V Bacon Menshew, PA-C  ibuprofen (ADVIL,MOTRIN) 600 MG tablet Take 1 tablet (600 mg total) by mouth every 6 (six) hours as needed. 05/11/16   Blakeley Margraf L Rosmery Duggin, PA-C  lactulose (CHRONULAC) 10 GM/15ML solution Take 30 mLs (20 g total) by mouth daily as needed for mild constipation. 09/05/15   Irean Hong, MD  lidocaine (XYLOCAINE) 2 % solution Use as directed 10 mLs in the mouth or throat every 4 (four) hours as needed for mouth pain. 05/11/16   Bellarose Burtt L Sasan Wilkie, PA-C  nitrofurantoin, macrocrystal-monohydrate, (MACROBID) 100 MG capsule Take 1 capsule (100 mg total) by mouth 2  (two) times daily. 02/01/15   Jenise V Bacon Menshew, PA-C    Allergies Review of patient's allergies indicates no known allergies.  No family history on file.  Social History Social History  Substance Use Topics  . Smoking status: Never Smoker  . Smokeless tobacco: Not on file  . Alcohol use No     Review of Systems  Constitutional: Positive fatigue but no fever or chills.  Eyes: No visual changes. No discharge ENT: Positive sore throat, nasal congestion, runny nose. No ear pain, ear drainage, sinus pressure. Cardiovascular: No chest pain. Respiratory: Positive nonproductive cough. No shortness of breath. No wheezing.  Gastrointestinal: No abdominal pain.  No nausea, vomiting.   Musculoskeletal: Positive for general myalgias.  Skin: Negative for rash. Neurological: Negative for headaches, focal weakness or numbness. 10-point ROS otherwise negative.  ____________________________________________   PHYSICAL EXAM:  VITAL SIGNS: ED Triage Vitals  Enc Vitals Group     BP 05/11/16 1333 (!) 110/57     Pulse Rate 05/11/16 1333 (!) 108     Resp 05/11/16 1333 18     Temp 05/11/16 1333 99 F (37.2 C)     Temp Source 05/11/16 1333 Oral     SpO2 05/11/16 1333 99 %     Weight 05/11/16 1333 (!) 349 lb (158.3 kg)     Height 05/11/16 1312 5\' 8"  (1.727 m)     Head Circumference --      Peak Flow --      Pain Score 05/11/16  1312 6     Pain Loc --      Pain Edu? --      Excl. in GC? --      Constitutional: Alert and oriented. Ill appearing but in no acute distress. Eyes: Conjunctivae are normal.  Head: Atraumatic. ENT:      Nose: No congestion/rhinnorhea.      Mouth/Throat: Bilateral tonsils with moderate erythema, mild swelling and white/gray exudate. Airways patent. Uvula is midline. Mucous membranes are moist.  Neck: Supple with full range of motion. Hematological/Lymphatic/Immunilogical: Positive left, anterior, focal cervical lymphadenopathy with tenderness to palpation  but is mobile. Cardiovascular: Normal rate of 94 BPM, regular rhythm. Normal S1 and S2.  Good peripheral circulation. Respiratory: Normal respiratory effort without tachypnea or retractions. Lungs CTAB with breath sounds noted in all lung fields. Neurologic:  Normal speech and language. No gross focal neurologic deficits are appreciated.  Skin:  Skin is warm, dry and intact. No rash noted. Psychiatric: Mood and affect are normal. Speech and behavior are normal. Patient exhibits appropriate insight and judgement.   ____________________________________________   LABS (all labs ordered are listed, but only abnormal results are displayed)  Labs Reviewed  POCT RAPID STREP A - Abnormal; Notable for the following:       Result Value   Streptococcus, Group A Screen (Direct) POSITIVE (*)    All other components within normal limits   ____________________________________________  EKG  None ____________________________________________  RADIOLOGY  None ____________________________________________    PROCEDURES  Procedure(s) performed: None   Procedures   Medications - No data to display   ____________________________________________   INITIAL IMPRESSION / ASSESSMENT AND PLAN / ED COURSE  Pertinent labs & imaging results that were available during my care of the patient were reviewed by me and considered in my medical decision making (see chart for details).  Clinical Course    Patient's diagnosis is consistent with Strep throat. Patient will be discharged home with prescriptions for amoxicillin, ibuprofen and lidocaine viscous use as directed. Patient is to follow up with her primary care provider if symptoms persist past this treatment course. Patient is given ED precautions to return to the ED for any worsening or new symptoms.    ____________________________________________  FINAL CLINICAL IMPRESSION(S) / ED DIAGNOSES  Final diagnoses:  Strep throat      NEW  MEDICATIONS STARTED DURING THIS VISIT:  New Prescriptions   AMOXICILLIN (AMOXIL) 875 MG TABLET    Take 1 tablet (875 mg total) by mouth 2 (two) times daily.   IBUPROFEN (ADVIL,MOTRIN) 600 MG TABLET    Take 1 tablet (600 mg total) by mouth every 6 (six) hours as needed.   LIDOCAINE (XYLOCAINE) 2 % SOLUTION    Use as directed 10 mLs in the mouth or throat every 4 (four) hours as needed for mouth pain.         Hope PigeonJami L Merrilee Ancona, PA-C 05/11/16 1415    Emily FilbertJonathan E Williams, MD 05/11/16 [phone]

## 2016-05-11 NOTE — ED Triage Notes (Signed)
Pt reports sore throat since this AM.

## 2016-05-11 NOTE — ED Notes (Signed)
Pt alert and oriented X4, active, cooperative, pt in NAD. RR even and unlabored, color WNL.  Pt informed to return if any life threatening symptoms occur.   

## 2016-05-20 ENCOUNTER — Emergency Department
Admission: EM | Admit: 2016-05-20 | Discharge: 2016-05-20 | Disposition: A | Discharge status: Home / Self Care | Payer: Medicaid Other | Attending: Emergency Medicine | Admitting: Emergency Medicine

## 2016-05-20 ENCOUNTER — Encounter: Payer: Self-pay | Admitting: Medical Oncology

## 2016-05-20 DIAGNOSIS — I10 Essential (primary) hypertension: Secondary | ICD-10-CM | POA: Diagnosis not present

## 2016-05-20 DIAGNOSIS — Z79899 Other long term (current) drug therapy: Secondary | ICD-10-CM | POA: Insufficient documentation

## 2016-05-20 DIAGNOSIS — B373 Candidiasis of vulva and vagina: Secondary | ICD-10-CM

## 2016-05-20 DIAGNOSIS — Z792 Long term (current) use of antibiotics: Secondary | ICD-10-CM | POA: Insufficient documentation

## 2016-05-20 DIAGNOSIS — B3731 Acute candidiasis of vulva and vagina: Secondary | ICD-10-CM

## 2016-05-20 DIAGNOSIS — N898 Other specified noninflammatory disorders of vagina: Secondary | ICD-10-CM | POA: Diagnosis present

## 2016-05-20 DIAGNOSIS — Z791 Long term (current) use of non-steroidal anti-inflammatories (NSAID): Secondary | ICD-10-CM | POA: Insufficient documentation

## 2016-05-20 MED ORDER — FLUCONAZOLE 150 MG PO TABS: 150.0000 mg | ORAL_TABLET | Freq: Once | ORAL | 0 refills | Status: AC

## 2016-05-20 NOTE — ED Provider Notes (Signed)
[facility] Emergency Department Provider Note  ____________________________________________  Time seen: Approximately 8:23 AM  I have reviewed the triage vital signs and the nursing notes.   HISTORY  Chief Complaint Vaginal Itching and Vaginal Discharge    HPI Vanessa Conner is a 25 y.o. female presents for evaluation of vaginal itching and whitish discharge. Patient reports recently starting antibiotics for infection has noticed itching. Patient reports past medical history the same.   Past Medical History:  Diagnosis Date  . Hypertension     There are no active problems to display for this patient.   History reviewed. No pertinent surgical history.  Prior to Admission medications   Medication Sig Start Date End Date Taking? Authorizing Provider  amoxicillin (AMOXIL) 875 MG tablet Take 1 tablet (875 mg total) by mouth 2 (two) times daily. 05/11/16   Jami L Hagler, PA-C  fluconazole (DIFLUCAN) 150 MG tablet Take 1 tablet (150 mg total) by mouth once. Wait 3 days and repeat if needed. 05/20/16 05/20/16  Charmayne Sheerharles M Geralynn Capri, PA-C  ibuprofen (ADVIL,MOTRIN) 600 MG tablet Take 1 tablet (600 mg total) by mouth every 6 (six) hours as needed. 05/11/16   Jami L Hagler, PA-C  lactulose (CHRONULAC) 10 GM/15ML solution Take 30 mLs (20 g total) by mouth daily as needed for mild constipation. 09/05/15   Irean HongJade J Sung, MD  lidocaine (XYLOCAINE) 2 % solution Use as directed 10 mLs in the mouth or throat every 4 (four) hours as needed for mouth pain. 05/11/16   Jami L Hagler, PA-C  nitrofurantoin, macrocrystal-monohydrate, (MACROBID) 100 MG capsule Take 1 capsule (100 mg total) by mouth 2 (two) times daily. 02/01/15   Jenise V Bacon Menshew, PA-C    Allergies Review of patient's allergies indicates no known allergies.  No family history on file.  Social History Social History  Substance Use Topics  . Smoking status: Never Smoker  . Smokeless tobacco: Not on file  .  Alcohol use No    Review of Systems Constitutional: No fever/chills Genitourinary: Negative for dysuria. Positive for vaginal itching and discharge. Musculoskeletal: Negative for back pain. Skin: Negative for rash. Neurological: Negative for headaches, focal weakness or numbness.  10-point ROS otherwise negative.  ____________________________________________   PHYSICAL EXAM:  VITAL SIGNS: ED Triage Vitals  Enc Vitals Group     BP 05/20/16 0808 (!) 154/88     Pulse Rate 05/20/16 0807 87     Resp 05/20/16 0807 18     Temp 05/20/16 0807 98.2 F (36.8 C)     Temp Source 05/20/16 0807 Oral     SpO2 05/20/16 0807 97 %     Weight 05/20/16 0807 (!) 349 lb (158.3 kg)     Height 05/20/16 0807 5\' 7"  (1.702 m)     Head Circumference --      Peak Flow --      Pain Score 05/20/16 0808 0     Pain Loc --      Pain Edu? --      Excl. in GC? --     Constitutional: Alert and oriented. Well appearing and in no acute distress. Gastrointestinal: Soft and nontender. No distention.  No CVA tenderness. Musculoskeletal: No lower extremity tenderness nor edema.  No joint effusions. Neurologic:  Normal speech and language. No gross focal neurologic deficits are appreciated. No gait instability. Skin:  Skin is warm, dry and intact. No rash noted. Psychiatric: Mood and affect are normal. Speech and behavior are normal.  ____________________________________________  LABS (all labs ordered are listed, but only abnormal results are displayed)  Labs Reviewed - No data to display ____________________________________________  EKG   ____________________________________________  RADIOLOGY   ____________________________________________   PROCEDURES  Procedure(s) performed: None  Critical Care performed: No  ____________________________________________   INITIAL IMPRESSION / ASSESSMENT AND PLAN / ED COURSE  Pertinent labs & imaging results that were available during my care of the  patient were reviewed by me and considered in my medical decision making (see chart for details). Review of the [place] CSRS was performed in accordance of the [facility] prior to dispensing any controlled drugs.  Vaginal candidiasis based on history. Rx given for Diflucan 150 mg by mouth 1. Follow-up with PCP or return to ER with any worsening symptomology.  Clinical Course    ____________________________________________   FINAL CLINICAL IMPRESSION(S) / ED DIAGNOSES  Final diagnoses:  Vaginal yeast infection  Vaginal itching     This chart was dictated using voice recognition software/Dragon. Despite best efforts to proofread, errors can occur which can change the meaning. Any change was purely unintentional.    Evangeline Dakin, PA-C 05/20/16 1610    Governor Rooks, MD 05/20/16 [phone]

## 2016-05-20 NOTE — ED Triage Notes (Signed)
Pt reports she was recently placed on amoxicillin and since she started it she has been having vaginal itching just like shes had a yeast infection in the past. Pt denies other sx's

## 2016-05-20 NOTE — ED Notes (Signed)
Pt reports that she has been taking amoxicillin and began experiencing vaginal itching on Tuesday. NAD noted.

## 2016-09-01 ENCOUNTER — Emergency Department
Admission: EM | Admit: 2016-09-01 | Discharge: 2016-09-01 | Disposition: A | Discharge status: Home / Self Care | Payer: Medicaid Other | Attending: Emergency Medicine | Admitting: Emergency Medicine

## 2016-09-01 ENCOUNTER — Encounter: Payer: Self-pay | Admitting: Emergency Medicine

## 2016-09-01 DIAGNOSIS — I10 Essential (primary) hypertension: Secondary | ICD-10-CM | POA: Diagnosis not present

## 2016-09-01 DIAGNOSIS — Z3A01 Less than 8 weeks gestation of pregnancy: Secondary | ICD-10-CM

## 2016-09-01 DIAGNOSIS — R339 Retention of urine, unspecified: Secondary | ICD-10-CM

## 2016-09-01 DIAGNOSIS — O26891 Other specified pregnancy related conditions, first trimester: Secondary | ICD-10-CM | POA: Insufficient documentation

## 2016-09-01 LAB — URINALYSIS, COMPLETE (UACMP) WITH MICROSCOPIC
BACTERIA UA: NONE SEEN
Bilirubin Urine: NEGATIVE
Glucose, UA: NEGATIVE mg/dL
Hgb urine dipstick: NEGATIVE
Ketones, ur: NEGATIVE mg/dL
Leukocytes, UA: NEGATIVE
Nitrite: NEGATIVE
PROTEIN: NEGATIVE mg/dL
SPECIFIC GRAVITY, URINE: 1.011 (ref 1.005–1.030)
pH: 7 (ref 5.0–8.0)

## 2016-09-01 LAB — PREGNANCY, URINE: Preg Test, Ur: POSITIVE — AB

## 2016-09-01 MED ORDER — PHENAZOPYRIDINE HCL 200 MG PO TABS
200.0000 mg | ORAL_TABLET | Freq: Once | ORAL | Status: AC
  Administered 2016-09-01: 200 mg via ORAL
  Filled 2016-09-01: qty 1

## 2016-09-01 MED ORDER — PHENAZOPYRIDINE HCL 200 MG PO TABS: 200.0000 mg | ORAL_TABLET | Freq: Three times a day (TID) | ORAL | 0 refills | Status: DC | PRN

## 2016-09-01 NOTE — ED Triage Notes (Signed)
Pt reports was having increased urination for 3 days then today started having decreased urination. Found out today she was pregnant per pt. [id number].

## 2016-09-01 NOTE — ED Provider Notes (Signed)
[facility] Emergency Department Provider Note        Time seen: ----------------------------------------- 6:42 PM on 09/01/2016 -----------------------------------------    I have reviewed the triage vital signs and the nursing notes.   HISTORY  Chief Complaint Urinary Retention    HPI Vanessa Conner is a 25 y.o. female presents to the ER for frequent urination for the last 3 days and today she started having some decreased urination and urinary retention. She thought today by her primary care doctor that she was pregnant. She is G4 P2 Ab1. She has had some abdominal pain recently but denies any today, denies fevers, chills or other complaints.   Past Medical History:  Diagnosis Date  . Hypertension     There are no active problems to display for this patient.   History reviewed. No pertinent surgical history.  Allergies Patient has no known allergies.  Social History Social History  Substance Use Topics  . Smoking status: Never Smoker  . Smokeless tobacco: Not on file  . Alcohol use No    Review of Systems Constitutional: Negative for fever. Cardiovascular: Negative for chest pain. Respiratory: Negative for shortness of breath. Gastrointestinal: Negative for abdominal pain, vomiting and diarrhea. Genitourinary: Positive for urinary retention Musculoskeletal: Negative for back pain. Skin: Negative for rash. Neurological: Negative for headaches, focal weakness or numbness.  10-point ROS otherwise negative.  ____________________________________________   PHYSICAL EXAM:  VITAL SIGNS: ED Triage Vitals  Enc Vitals Group     BP 09/01/16 1716 (!) 151/93     Pulse Rate 09/01/16 1716 91     Resp 09/01/16 1716 18     Temp 09/01/16 1716 98.7 F (37.1 C)     Temp Source 09/01/16 1716 Oral     SpO2 09/01/16 1716 98 %     Weight 09/01/16 1715 (!) 356 lb (161.5 kg)     Height 09/01/16 1715 5\' 7"  (1.702 m)     Head Circumference --       Peak Flow --      Pain Score --      Pain Loc --      Pain Edu? --      Excl. in GC? --     Constitutional: Alert and oriented. Well appearing and in no distress.Morbidly obese Eyes: Conjunctivae are normal. PERRL. Normal extraocular movements. ENT   Head: Normocephalic and atraumatic.   Nose: No congestion/rhinnorhea.   Mouth/Throat: Mucous membranes are moist.   Neck: No stridor. Cardiovascular: Normal rate, regular rhythm. No murmurs, rubs, or gallops. Respiratory: Normal respiratory effort without tachypnea nor retractions. Breath sounds are clear and equal bilaterally. No wheezes/rales/rhonchi. Gastrointestinal: Soft and nontender. Normal bowel sounds Musculoskeletal: Nontender with normal range of motion in all extremities. No lower extremity tenderness nor edema. Neurologic:  Normal speech and language. No gross focal neurologic deficits are appreciated.  Skin:  Skin is warm, dry and intact. No rash noted. Psychiatric: Mood and affect are normal. Speech and behavior are normal.  ____________________________________________  ED COURSE:  Pertinent labs & imaging results that were available during my care of the patient were reviewed by me and considered in my medical decision making (see chart for details). Clinical Course   Patient presents to the ER in no distress, we will assess with bladder scan and urinalysis.  Procedures ____________________________________________   LABS (pertinent positives/negatives)  Labs Reviewed  URINALYSIS, COMPLETE (UACMP) WITH MICROSCOPIC - Abnormal; Notable for the following:       Result Value  Color, Urine YELLOW (*)    APPearance CLEAR (*)    Squamous Epithelial / LPF 0-5 (*)    All other components within normal limits  PREGNANCY, URINE - Abnormal; Notable for the following:    Preg Test, Ur POSITIVE (*)    All other components within normal limits   ____________________________________________  FINAL  ASSESSMENT AND PLAN  Urinary retention  Plan: Patient with labs as dictated above. Patient is in no acute distress, she was able to urinate after receiving Pyridium which is safe in pregnancy. I will prescribe a short supply of same. She is stable for outpatient follow-up.   [name], Willian Donson E, MD   Note: This dictation was prepared with Dragon dictation. Any transcriptional errors that result from this process are unintentional    Emily FilbertJonathan E Nation Cradle, MD 09/01/16 2018

## 2016-09-01 NOTE — ED Notes (Signed)
200 cc of urine was collected from pt via in& out cath

## 2016-09-01 NOTE — ED Notes (Signed)
Bladder scan results , approx 

## 2016-09-07 ENCOUNTER — Encounter: Payer: Self-pay | Admitting: Emergency Medicine

## 2016-09-07 ENCOUNTER — Emergency Department: Payer: Medicaid Other

## 2016-09-07 ENCOUNTER — Emergency Department
Admission: EM | Admit: 2016-09-07 | Discharge: 2016-09-07 | Disposition: A | Discharge status: Home / Self Care | Payer: Medicaid Other | Attending: Emergency Medicine | Admitting: Emergency Medicine

## 2016-09-07 DIAGNOSIS — I10 Essential (primary) hypertension: Secondary | ICD-10-CM | POA: Insufficient documentation

## 2016-09-07 DIAGNOSIS — Z791 Long term (current) use of non-steroidal anti-inflammatories (NSAID): Secondary | ICD-10-CM | POA: Diagnosis not present

## 2016-09-07 DIAGNOSIS — R05 Cough: Secondary | ICD-10-CM | POA: Diagnosis present

## 2016-09-07 DIAGNOSIS — Z3A01 Less than 8 weeks gestation of pregnancy: Secondary | ICD-10-CM | POA: Insufficient documentation

## 2016-09-07 DIAGNOSIS — J40 Bronchitis, not specified as acute or chronic: Secondary | ICD-10-CM

## 2016-09-07 DIAGNOSIS — Z79899 Other long term (current) drug therapy: Secondary | ICD-10-CM | POA: Insufficient documentation

## 2016-09-07 DIAGNOSIS — O99511 Diseases of the respiratory system complicating pregnancy, first trimester: Secondary | ICD-10-CM | POA: Insufficient documentation

## 2016-09-07 LAB — RAPID INFLUENZA A&B ANTIGENS (ARMC ONLY): INFLUENZA B (ARMC): NEGATIVE

## 2016-09-07 LAB — RAPID INFLUENZA A&B ANTIGENS: Influenza A (ARMC): NEGATIVE

## 2016-09-07 MED ORDER — AZITHROMYCIN 250 MG PO TABS: ORAL_TABLET | ORAL | 0 refills | Status: DC

## 2016-09-07 MED ORDER — ACETAMINOPHEN 500 MG PO TABS
1000.0000 mg | ORAL_TABLET | Freq: Once | ORAL | Status: AC
  Administered 2016-09-07: 1000 mg via ORAL
  Filled 2016-09-07: qty 2

## 2016-09-07 MED ORDER — AZITHROMYCIN 500 MG PO TABS
500.0000 mg | ORAL_TABLET | Freq: Once | ORAL | Status: AC
  Administered 2016-09-07: 500 mg via ORAL
  Filled 2016-09-07: qty 1

## 2016-09-07 NOTE — ED Triage Notes (Signed)
Patient presents to ED via POV with c/o cough and chest congestion since yesterday. Patient is [redacted] weeks pregnant and states, "I didn't take anything at home because I wasn't sure what I was able to take". Patient ambulatory to room. A&O x4.

## 2016-09-07 NOTE — ED Provider Notes (Signed)
[facility] Emergency Department Provider Note  ____________________________________________  Time seen: Approximately 5:58 PM  I have reviewed the triage vital signs and the nursing notes.   HISTORY  Chief Complaint Cough   HPI Vanessa Conner is a 26 y.o. female G4P3 at [redacted] weeks GA presents for evaluation of coughing productive of green/brown sputum. Patient reports that her symptoms have been present for the last few days. She also has had chills, nausea, 2 episodes of nonbloody nonbilious emesis, body aches, and frontal throbbing moderate HA. No diarrhea, no fever, no chest pain. She endorses mild shortness of breath. No abdominal pain, no vaginal bleeding, no dysuria. Patient has not established care for this pregnancy yet.  Past Medical History:  Diagnosis Date  . Hypertension     There are no active problems to display for this patient.   History reviewed. No pertinent surgical history.  Prior to Admission medications   Medication Sig Start Date End Date Taking? Authorizing Provider  amoxicillin (AMOXIL) 875 MG tablet Take 1 tablet (875 mg total) by mouth 2 (two) times daily. 05/11/16   Jami L Hagler, PA-C  azithromycin (ZITHROMAX) 250 MG tablet Take 1 a day for 4 days 09/07/16   Nita Sicklearolina Dajanae Brophy, MD  ibuprofen (ADVIL,MOTRIN) 600 MG tablet Take 1 tablet (600 mg total) by mouth every 6 (six) hours as needed. 05/11/16   Jami L Hagler, PA-C  lactulose (CHRONULAC) 10 GM/15ML solution Take 30 mLs (20 g total) by mouth daily as needed for mild constipation. 09/05/15   Irean HongJade J Sung, MD  lidocaine (XYLOCAINE) 2 % solution Use as directed 10 mLs in the mouth or throat every 4 (four) hours as needed for mouth pain. 05/11/16   Jami L Hagler, PA-C  nitrofurantoin, macrocrystal-monohydrate, (MACROBID) 100 MG capsule Take 1 capsule (100 mg total) by mouth 2 (two) times daily. 02/01/15   Jenise V Bacon Menshew, PA-C  phenazopyridine (PYRIDIUM) 200 MG tablet Take 1 tablet  (200 mg total) by mouth 3 (three) times daily as needed for pain. 09/01/16 09/01/17  Emily FilbertJonathan E Williams, MD    Allergies Patient has no known allergies.  No family history on file.  Social History Social History  Substance Use Topics  . Smoking status: Never Smoker  . Smokeless tobacco: Never Used  . Alcohol use No    Review of Systems  Constitutional: Negative for fever. + chills and body aches Eyes: Negative for visual changes. ENT: Negative for sore throat. Neck: No neck pain  Cardiovascular: Negative for chest pain. Respiratory: + shortness of breath and cough Gastrointestinal: Negative for abdominal pain, vomiting or diarrhea. + nausea Genitourinary: Negative for dysuria. Musculoskeletal: Negative for back pain. Skin: Negative for rash. Neurological: Negative for headaches, weakness or numbness. Psych: No SI or HI  ____________________________________________   PHYSICAL EXAM:  VITAL SIGNS: ED Triage Vitals  Enc Vitals Group     BP 09/07/16 1607 (!) 159/92     Pulse Rate 09/07/16 1607 (!) 107     Resp 09/07/16 1607 19     Temp 09/07/16 1607 99.2 F (37.3 C)     Temp Source 09/07/16 1607 Oral     SpO2 09/07/16 1607 99 %     Weight 09/07/16 1608 (!) 356 lb (161.5 kg)     Height 09/07/16 1608 5\' 7"  (1.702 m)     Head Circumference --      Peak Flow --      Pain Score 09/07/16 1608 8  Pain Loc --      Pain Edu? --      Excl. in GC? --     Constitutional: Alert and oriented. Well appearing and in no apparent distress. HEENT:      Head: Normocephalic and atraumatic.         Eyes: Conjunctivae are normal. Sclera is non-icteric. EOMI. PERRL      Mouth/Throat: Mucous membranes are moist.       Neck: Supple with no signs of meningismus. Cardiovascular: Tachycardic with regular rhythm. No murmurs, gallops, or rubs. 2+ symmetrical distal pulses are present in all extremities. No JVD. Respiratory: Normal respiratory effort. Lungs are clear to auscultation  bilaterally. No wheezes, crackles, or rhonchi.  Gastrointestinal: Soft, non tender, and non distended with positive bowel sounds. No rebound or guarding. Genitourinary: No CVA tenderness. Musculoskeletal: Nontender with normal range of motion in all extremities. No edema, cyanosis, or erythema of extremities. Neurologic: Normal speech and language. Face is symmetric. Moving all extremities. No gross focal neurologic deficits are appreciated. Skin: Skin is warm, dry and intact. No rash noted. Psychiatric: Mood and affect are normal. Speech and behavior are normal.  ____________________________________________   LABS (all labs ordered are listed, but only abnormal results are displayed)  Labs Reviewed  RAPID INFLUENZA A&B ANTIGENS ([facility] ONLY)   ____________________________________________  EKG  none  ____________________________________________  RADIOLOGY  CXR: No acute process  ____________________________________________   PROCEDURES  Procedure(s) performed: None Procedures Critical Care performed:  None ____________________________________________   INITIAL IMPRESSION / ASSESSMENT AND PLAN / ED COURSE  26 y.o. female G4P3 at [redacted] weeks GA presents for evaluation of flu like symptoms x 2 days with productive cough, body aches, nausea, SOB, HA, and chills. VS WNl with mild tachycardia which is normal during pregnancy. Lungs CTAB, no respiratory distress. Flu negative. CXR negative however clinically patient seems to have pneumonia. Will start patient on azithromycin and tylenol for body aches. Patient has appointment with her OB/GYN for this pregnancy. She has no abdominal pain or vaginal bleeding and therefore no further workup for the pregnancy is indicated at this time. Patient was recommended not to take any NSAIDs because of her pregnancy and use Tylenol instead for body aches. She'll follow-up with her primary care doctor within the next few days.  Clinical Course      Pertinent labs & imaging results that were available during my care of the patient were reviewed by me and considered in my medical decision making (see chart for details).    ____________________________________________   FINAL CLINICAL IMPRESSION(S) / ED DIAGNOSES  Final diagnoses:  Bronchitis      NEW MEDICATIONS STARTED DURING THIS VISIT:  New Prescriptions   AZITHROMYCIN (ZITHROMAX) 250 MG TABLET    Take 1 a day for 4 days     Note:  This document was prepared using Dragon voice recognition software and may include unintentional dictation errors.    Nita Sickle, MD 09/07/16 [phone]

## 2016-12-20 ENCOUNTER — Encounter: Payer: Self-pay | Admitting: Emergency Medicine

## 2016-12-20 ENCOUNTER — Emergency Department
Admission: EM | Admit: 2016-12-20 | Discharge: 2016-12-20 | Disposition: A | Discharge status: Home / Self Care | Payer: Medicaid Other | Attending: Emergency Medicine | Admitting: Emergency Medicine

## 2016-12-20 DIAGNOSIS — J029 Acute pharyngitis, unspecified: Secondary | ICD-10-CM | POA: Diagnosis present

## 2016-12-20 DIAGNOSIS — Z79899 Other long term (current) drug therapy: Secondary | ICD-10-CM | POA: Diagnosis not present

## 2016-12-20 DIAGNOSIS — I10 Essential (primary) hypertension: Secondary | ICD-10-CM | POA: Diagnosis not present

## 2016-12-20 DIAGNOSIS — J301 Allergic rhinitis due to pollen: Secondary | ICD-10-CM

## 2016-12-20 DIAGNOSIS — J040 Acute laryngitis: Secondary | ICD-10-CM

## 2016-12-20 MED ORDER — FLUTICASONE PROPIONATE 50 MCG/ACT NA SUSP: 2.0000 | Freq: Every day | NASAL | 0 refills | Status: DC

## 2016-12-20 NOTE — Discharge Instructions (Signed)
Follow-up at [facility] clinic if any continued problems. Be sure to take your blood pressure medicine every day. Begin using Flonase nasal spray 2 sprays to each nostril once a day. Also obtain Zyrtec or Claritin over-the-counter for allergy symptoms. Increase fluids. Voice rest as much as possible.

## 2016-12-20 NOTE — ED Triage Notes (Signed)
Pt to ed with c/o sore throat that started yesterday, denies fever. Does report cough, congestion.

## 2016-12-20 NOTE — ED Provider Notes (Signed)
[facility] Emergency Department Provider Note  ____________________________________________   First MD Initiated Contact with Patient 12/20/16 1440     (approximate)  I have reviewed the triage vital signs and the nursing notes.   HISTORY  Chief Complaint Sore Throat    HPI Vanessa Conner is a 26 y.o. female is here complaining of sore throat.Patient states she began having sore throat along with cough and congestion yesterday. She's also had some sneezing. She acknowledges that she has had problems with allergies in the past. She also lost her voice and is able to whisper. She denies any fever or chills. She admits to a history of hypertension and is currently on medication however she did not take it today. Patient rates her pain as 5/10.   Past Medical History:  Diagnosis Date  . Hypertension     There are no active problems to display for this patient.   History reviewed. No pertinent surgical history.  Prior to Admission medications   Medication Sig Start Date End Date Taking? Authorizing Provider  fluticasone (FLONASE) 50 MCG/ACT nasal spray Place 2 sprays into both nostrils daily. 12/20/16 12/20/17  Tommi Rumps, PA-C  lactulose (CHRONULAC) 10 GM/15ML solution Take 30 mLs (20 g total) by mouth daily as needed for mild constipation. 09/05/15   Irean Hong, MD    Allergies Patient has no known allergies.  History reviewed. No pertinent family history.  Social History Social History  Substance Use Topics  . Smoking status: Never Smoker  . Smokeless tobacco: Never Used  . Alcohol use No    Review of Systems Constitutional: No fever/chills Eyes: No visual changes. ENT: Positive sore throat. Positive sneezing and nasal congestion. Positive laryngitis. Cardiovascular: Denies chest pain. Respiratory: Denies shortness of breath. Positive occasional coughing. Gastrointestinal:   No nausea, no vomiting.   Neurological: Negative for  headaches, focal weakness or numbness.  10-point ROS otherwise negative.  ____________________________________________   PHYSICAL EXAM:  VITAL SIGNS: ED Triage Vitals  Enc Vitals Group     BP 12/20/16 1337 (!) 155/97     Pulse Rate 12/20/16 1337 99     Resp 12/20/16 1337 18     Temp 12/20/16 1337 99.9 F (37.7 C)     Temp Source 12/20/16 1337 Oral     SpO2 12/20/16 1337 97 %     Weight 12/20/16 1337 (!) 341 lb (154.7 kg)     Height 12/20/16 1337  (1.702 m)     Head Circumference --      Peak Flow --      Pain Score 12/20/16 1336 5     Pain Loc --      Pain Edu? --      Excl. in GC? --     Constitutional: Alert and oriented. Well appearing and in no acute distress. Positive laryngitis. Patient is able to whisper to communicate. Eyes: Conjunctivae are normal. PERRL. EOMI. Head: Atraumatic. Nose: Mild congestion/no rhinnorhea.  EACs are clear. TMs are dull without injection or erythema. Mouth/Throat: Mucous membranes are moist.  Oropharynx non-erythematous. Neck: No stridor.   Hematological/Lymphatic/Immunilogical: No cervical lymphadenopathy. Cardiovascular: Normal rate, regular rhythm. Grossly normal heart sounds.  Good peripheral circulation. Respiratory: Normal respiratory effort.  No retractions. Lungs CTAB. Musculoskeletal: Moves upper and lower extremities benign difficulty. Normal gait was noted. Neurologic:  Normal speech and language. No gross focal neurologic deficits are appreciated. No gait instability. Skin:  Skin is warm, dry and intact. No rash noted.  Psychiatric: Mood and affect are normal. Speech and behavior are normal.  ____________________________________________   LABS (all labs ordered are listed, but only abnormal results are displayed)  Labs Reviewed - No data to display   PROCEDURES  Procedure(s) performed: None  Procedures  Critical Care performed: No  ____________________________________________   INITIAL IMPRESSION /  ASSESSMENT AND PLAN / ED COURSE  Pertinent labs & imaging results that were available during my care of the patient were reviewed by me and considered in my medical decision making (see chart for details).  Patient does have a history of hypertension but did not take her blood pressure medication today. Blood pressure elevated at 155/97. She is to increase fluids. She is given a prescription for Flonase nasal spray and to obtain Claritin or Zyrtec over-the-counter. Patient is follow-up with her primary care doctor at [facility] if any continued problems.      ____________________________________________   FINAL CLINICAL IMPRESSION(S) / ED DIAGNOSES  Final diagnoses:  Laryngitis, acute  Seasonal allergic rhinitis due to pollen      NEW MEDICATIONS STARTED DURING THIS VISIT:  Discharge Medication List as of 12/20/2016  3:09 PM    START taking these medications   Details  fluticasone (FLONASE) 50 MCG/ACT nasal spray Place 2 sprays into both nostrils daily., Starting Mon 12/20/2016, Until Tue 12/20/2017, Print         Note:  This document was prepared using Dragon voice recognition software and may include unintentional dictation errors.    Tommi Rumps, PA-C 12/20/16 1854    Jene Every, MD 12/21/16 [phone]

## 2017-03-23 ENCOUNTER — Emergency Department
Admission: EM | Admit: 2017-03-23 | Discharge: 2017-03-23 | Disposition: A | Discharge status: Home / Self Care | Payer: Medicaid Other | Attending: Emergency Medicine | Admitting: Emergency Medicine

## 2017-03-23 ENCOUNTER — Encounter: Payer: Self-pay | Admitting: Emergency Medicine

## 2017-03-23 DIAGNOSIS — R45851 Suicidal ideations: Secondary | ICD-10-CM

## 2017-03-23 DIAGNOSIS — F4325 Adjustment disorder with mixed disturbance of emotions and conduct: Secondary | ICD-10-CM

## 2017-03-23 DIAGNOSIS — F329 Major depressive disorder, single episode, unspecified: Secondary | ICD-10-CM | POA: Insufficient documentation

## 2017-03-23 DIAGNOSIS — F32A Depression, unspecified: Secondary | ICD-10-CM

## 2017-03-23 DIAGNOSIS — I10 Essential (primary) hypertension: Secondary | ICD-10-CM | POA: Insufficient documentation

## 2017-03-23 HISTORY — DX: Adjustment disorder with mixed disturbance of emotions and conduct: F43.25

## 2017-03-23 LAB — CBC
HEMATOCRIT: 35.1 % (ref 35.0–47.0)
HEMOGLOBIN: 11.7 g/dL — AB (ref 12.0–16.0)
MCH: 24.1 pg — AB (ref 26.0–34.0)
MCHC: 33.4 g/dL (ref 32.0–36.0)
MCV: 72.2 fL — ABNORMAL LOW (ref 80.0–100.0)
Platelets: 318 10*3/uL (ref 150–440)
RBC: 4.86 MIL/uL (ref 3.80–5.20)
RDW: 17.8 % — ABNORMAL HIGH (ref 11.5–14.5)
WBC: 8 10*3/uL (ref 3.6–11.0)

## 2017-03-23 LAB — COMPREHENSIVE METABOLIC PANEL
ALBUMIN: 3.7 g/dL (ref 3.5–5.0)
ALT: 16 U/L (ref 14–54)
AST: 19 U/L (ref 15–41)
Alkaline Phosphatase: 110 U/L (ref 38–126)
Anion gap: 8 (ref 5–15)
BUN: 10 mg/dL (ref 6–20)
CO2: 25 mmol/L (ref 22–32)
CREATININE: 0.74 mg/dL (ref 0.44–1.00)
Calcium: 9.2 mg/dL (ref 8.9–10.3)
Chloride: 106 mmol/L (ref 101–111)
GFR calc Af Amer: >60 mL/min (ref >60)
GFR calc non Af Amer: >60 mL/min (ref >60)
GLUCOSE: 115 mg/dL — AB (ref 65–99)
Potassium: 3.9 mmol/L (ref 3.5–5.1)
SODIUM: 139 mmol/L (ref 135–145)
Total Bilirubin: 0.6 mg/dL (ref 0.3–1.2)
Total Protein: 8.2 g/dL — ABNORMAL HIGH (ref 6.5–8.1)

## 2017-03-23 LAB — URINE DRUG SCREEN, QUALITATIVE (ARMC ONLY)
Amphetamines, Ur Screen: NOT DETECTED
BARBITURATES, UR SCREEN: NOT DETECTED
Benzodiazepine, Ur Scrn: NOT DETECTED
COCAINE METABOLITE, UR ~~LOC~~: NOT DETECTED
Cannabinoid 50 Ng, Ur ~~LOC~~: NOT DETECTED
MDMA (ECSTASY) UR SCREEN: NOT DETECTED
METHADONE SCREEN, URINE: NOT DETECTED
OPIATE, UR SCREEN: NOT DETECTED
Phencyclidine (PCP) Ur S: NOT DETECTED
Tricyclic, Ur Screen: NOT DETECTED

## 2017-03-23 LAB — ETHANOL: Alcohol, Ethyl (B): <5 mg/dL (ref <5)

## 2017-03-23 LAB — POCT PREGNANCY, URINE: Preg Test, Ur: NEGATIVE

## 2017-03-23 LAB — SALICYLATE LEVEL: Salicylate Lvl: <7 mg/dL (ref 2.8–30.0)

## 2017-03-23 LAB — ACETAMINOPHEN LEVEL: Acetaminophen (Tylenol), Serum: <10 ug/mL — ABNORMAL LOW (ref 10–30)

## 2017-03-23 NOTE — ED Triage Notes (Signed)
Pt presents to ED via [facility]. Pt states that she is currently having suicidal thoughts today with a plan of hurting herself by wrecking her car, pt reports 2 weeks ago she had thoughts about hurting her 2 children at home by also wrecking her car. Pt denies any thoughts about wanting to hurt her children today, states just wants to hurt herself. Upon arrival to triage area, pt wanded by BPD with pt permission.

## 2017-03-23 NOTE — BH Assessment (Signed)
Assessment Note  Vanessa Conner is an 26 y.o. female who presents to the ER after she "had breakdown" at her job. She states, she start crying and made statements "I didn't mean." She further explains she recently gained custody of family member child that is 40 months old. It's unclear if he have medical problems and he isn't sleeping well. She states she is feeling overwhelmed and stress. On today (03/23/2017), "I just got to my breaking point."    Patient reported to staff she had thoughts of ending her life and her children's life. She explained to this Clinical research associate she has no plans or intentions to follow through with it. She further explains, "they were just thoughts. I wouldn't do that. That's stupid." When asked about wrecking her truck, she laughed and stated, "that's dumb to. I'm trying to sell it. I won't get no money if it's broke."  During the interview, the patient was calm, cooperative and pleasant. She voiced her frustration about been in the ER, because "it was a big misunderstanding."  With this Clinical research associate, patient denies SI/HI and AV/H.  She denies involvement the legal system. She also denies having a history of violence.    Past Medical History:  Past Medical History:  Diagnosis Date  . Hypertension     History reviewed. No pertinent surgical history.  Family History: History reviewed. No pertinent family history.  Social History:  reports that she has never smoked. She has never used smokeless tobacco. She reports that she does not drink alcohol or use drugs.  Additional Social History:  Alcohol / Drug Use Pain Medications: See PTA Prescriptions: See PTA Over the Counter: See PTA History of alcohol / drug use?: No history of alcohol / drug abuse Longest period of sobriety (when/how long): Reports of none Negative Consequences of Use:  (n/a) Withdrawal Symptoms:  (n/a)  CIWA: CIWA-Ar BP: (!) 154/93 Pulse Rate: 81 COWS:    Allergies: No Known Allergies  Home  Medications:  (Not in a hospital admission)  OB/GYN Status:  Patient's last menstrual period was 03/05/2017 (approximate).  General Assessment Data Location of Assessment: [facility] ED TTS Assessment: In system Is this a Tele or Face-to-Face Assessment?: Face-to-Face Is this an Initial Assessment or a Re-assessment for this encounter?: Initial Assessment Marital status: Single Maiden name: n/a Is patient pregnant?: No Pregnancy Status: No Living Arrangements: Children Can pt return to current living arrangement?: Yes Admission Status: Voluntary Is patient capable of signing voluntary admission?: Yes Referral Source: Other (From Job) Insurance type: Medicaid  Medical Screening Exam [facility] Walk-in ONLY) Medical Exam completed: Yes  Crisis Care Plan Living Arrangements: Children Legal Guardian: Other: (Self) Name of Psychiatrist: Reports of none Name of Therapist: Reports of none  Education Status Is patient currently in school?: No Current Grade: n/a Highest grade of school patient has completed: n/a Name of school: n/a Contact person: n/a  Risk to self with the past 6 months Suicidal Ideation: No Has patient been a risk to self within the past 6 months prior to admission? : No Suicidal Intent: No Has patient had any suicidal intent within the past 6 months prior to admission? : No Is patient at risk for suicide?: No Suicidal Plan?: No Has patient had any suicidal plan within the past 6 months prior to admission? : No Access to Means: No What has been your use of drugs/alcohol within the last 12 months?: Reports of none Previous Attempts/Gestures: No How many times?: 0 Other Self Harm Risks: Reports of  none Triggers for Past Attempts: None known Intentional Self Injurious Behavior: None Family Suicide History: No Recent stressful life event(s): Other (Comment) (recent custody of 65 months old relative) Persecutory voices/beliefs?: No Depression: Yes Depression Symptoms:  Feeling worthless/self pity, Loss of interest in usual pleasures, Feeling angry/irritable Substance abuse history and/or treatment for substance abuse?: No Suicide prevention information given to non-admitted patients: Not applicable  Risk to Others within the past 6 months Homicidal Ideation: No Does patient have any lifetime risk of violence toward others beyond the six months prior to admission? : No Thoughts of Harm to Others: No Current Homicidal Intent: No Current Homicidal Plan: No Access to Homicidal Means: No Identified Victim: Reports of none History of harm to others?: No Assessment of Violence: None Noted Violent Behavior Description: Reports of none Does patient have access to weapons?: No Criminal Charges Pending?: No Does patient have a court date: No Is patient on probation?: No  Psychosis Hallucinations: None noted Delusions: None noted  Mental Status Report Appearance/Hygiene: Unremarkable, In scrubs Eye Contact: Fair Motor Activity: Freedom of movement Speech: Logical/coherent, Unremarkable Level of Consciousness: Alert Mood: Depressed, Anxious, Sad, Pleasant Affect: Appropriate to circumstance, Depressed, Sad Anxiety Level: Minimal Thought Processes: Coherent, Relevant Judgement: Unimpaired Orientation: Person, Place, Time, Situation, Appropriate for developmental age Obsessive Compulsive Thoughts/Behaviors: Minimal  Cognitive Functioning Concentration: Normal Memory: Recent Intact, Remote Intact IQ: Average Insight: Fair Appetite: Fair Weight Loss: 0 Weight Gain: 0 Sleep: Decreased Total Hours of Sleep: 5 Vegetative Symptoms: None  ADLScreening [facility] Assessment Services) Patient's cognitive ability adequate to safely complete daily activities?: Yes Patient able to express need for assistance with ADLs?: Yes Independently performs ADLs?: Yes (appropriate for developmental age)  Prior Inpatient Therapy Prior Inpatient Therapy: No Prior  Therapy Dates: Reports of none Prior Therapy Facilty/Provider(s): Reports of none Reason for Treatment: Reports of none  Prior Outpatient Therapy Prior Outpatient Therapy: No Prior Therapy Dates: Reports of none Prior Therapy Facilty/Provider(s): Reports of none Reason for Treatment: Reports of none Does patient have an ACCT team?: No Does patient have Intensive In-House Services?  : No Does patient have [facility] services? : No Does patient have P4CC services?: No  ADL Screening (condition at time of admission) Patient's cognitive ability adequate to safely complete daily activities?: Yes Is the patient deaf or have difficulty hearing?: No Does the patient have difficulty seeing, even when wearing glasses/contacts?: No Does the patient have difficulty concentrating, remembering, or making decisions?: No Patient able to express need for assistance with ADLs?: Yes Does the patient have difficulty dressing or bathing?: No Independently performs ADLs?: Yes (appropriate for developmental age) Does the patient have difficulty walking or climbing stairs?: No Weakness of Legs: None Weakness of Arms/Hands: None  Home Assistive Devices/Equipment Home Assistive Devices/Equipment: None  Therapy Consults (therapy consults require a physician order) PT Evaluation Needed: No OT Evalulation Needed: No SLP Evaluation Needed: No Abuse/Neglect Assessment (Assessment to be complete while patient is alone) Physical Abuse: Denies Verbal Abuse: Denies Sexual Abuse: Denies Exploitation of patient/patient's resources: Denies Self-Neglect: Denies Values / Beliefs Cultural Requests During Hospitalization: None Spiritual Requests During Hospitalization: None Consults Spiritual Care Consult Needed: No Social Work Consult Needed: No Merchant navy officer (For Healthcare) Does Patient Have a Medical Advance Directive?: No Would patient like information on creating a medical advance directive?: No -  Patient declined Nutrition Screen- [facility] Adult/WL/AP Patient's home diet: Regular  Additional Information 1:1 In Past 12 Months?: No CIRT Risk: No Elopement Risk: No Does patient have medical clearance?:  Yes  Child/Adolescent Assessment Running Away Risk: Denies (Patient is an adult)  Disposition:  Disposition Initial Assessment Completed for this Encounter: Yes Disposition of Patient: Other dispositions (ER MD Ordered Psych Consult)  On Site Evaluation by:   Reviewed with Physician:    Lilyan Gilfordalvin J. Vittoria Noreen MS, LCAS, LPC, NCC, CCSI Therapeutic Triage Specialist 03/23/2017 1:44 PM

## 2017-03-23 NOTE — ED Notes (Signed)
Pt requesting to leave. MD informed. MD to bedside. Pt aggreable to stay.

## 2017-03-23 NOTE — ED Notes (Signed)
First nurse note  Presents via ems with S/I   Denies any attempt

## 2017-03-23 NOTE — ED Notes (Signed)
Pt dressed out by this RN. Pink and black bookbag placed in patient belongings bag, $8, cell phone placed in book bag. 1 pair beige shoes, 1 pair pink underwear, 1 pair blue jeans, 1 peach colored shirt, 1 white undershirt, 1 black bra. Pt also had sand which in bookbag that she instructed this RN to throw out.

## 2017-03-23 NOTE — ED Notes (Signed)
Pt requesting to leave again. States "I just want to see my own people". TTS called, in to see patient. MD informed, no new orders at this time.

## 2017-03-23 NOTE — Consult Note (Signed)
[facility] Consult   Reason for Consult:  Consult for this 26 year old woman voluntarily in the emergency room with emotional distress and suicidal thoughts Referring Physician:  Reita Cliche Patient Identification: Vanessa Conner MRN:  [id number] Principal Diagnosis: Adjustment disorder with mixed disturbance of emotions and conduct Diagnosis:   Patient Active Problem List   Diagnosis Date Noted  . Adjustment disorder with mixed disturbance of emotions and conduct [F43.25] 03/23/2017    Total Time spent with patient: 1 hour  Subjective:   Vanessa Conner is a 26 y.o. female patient admitted with "I had a little breakdown".  HPI:  Patient interviewed chart reviewed. Patient came to the emergency room this morning voluntarily from work saying that she was having an emotional breakdown. When she first presented today she told nursing that she was having some suicidal thoughts specifically of wrecking her car. On interview with me the patient says that she thinks she was just feeling overwhelmed because she did not sleep well last night. She was up most of the night with the baby she is taking care of. She says she has felt like she is under a lot of stress. Takes care of 3 children at home and works full time during the day at a stressful job. Minimizes the degree to which she is upset on a daily basis however. Denies that she has poor sleep every night. Denies other physical symptoms. Patient says she did not have any serious thoughts of harming herself and completely denies any suicidal thoughts now. Says that she has multiple positive things in her life to live for. She had made a mention in one of the notes earlier about having thoughts of hurting her children as well but the patient completely denies this saying she has no thoughts of hurting any of the children are doing anything violent. She is not currently receiving any mental health treatment. Not on any medicine other than  blood pressure medicine. Denies that she's drinking or using drugs.  Social history: Patient has 3 children at home too of her own and a 65-monthold baby that belongs to her sister. Patient reports that her only support is her grandfather who lives nearby. 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  Medical history: High blood pressure but no other known ongoing medical problems no specific medical complaints. I did notice that back in December 2017 and even into January 2018 the patient was pregnant. She is not pregnant now and appears not to have had the child it's not clear what's going on with that she didn't mention it to me.  Substance abuse history: Says she does not drink or use drugs. No evidence of past substance abuse    Past Psychiatric History: Patient says that she has not received any mental health treatment. Has not been seeing a counselor or therapist. Never been on any psychiatric medicine. Denies inpatient hospitalization denies any history of suicidal or violent behavior in the past.  Risk to Self: Suicidal Ideation: No Suicidal Intent: No Is patient at risk for suicide?: No Suicidal Plan?: No Access to Means: No What has been your use of drugs/alcohol within the last 12 months?: Reports of none How many times?: 0 Other Self Harm Risks: Reports of none Triggers for Past Attempts: None known Intentional Self Injurious Behavior: None Risk to Others: Homicidal Ideation: No Thoughts of Harm to Others: No Current Homicidal Intent: No  Current Homicidal Plan: No Access to Homicidal Means: No Identified Victim: Reports of none History of harm to others?: No Assessment of Violence: None Noted Violent Behavior Description: Reports of none Does patient have access to weapons?: No Criminal Charges Pending?: No Does patient have a court date: No Prior Inpatient Therapy: Prior Inpatient  Therapy: No Prior Therapy Dates: Reports of none Prior Therapy Facilty/Provider(s): Reports of none Reason for Treatment: Reports of none Prior Outpatient Therapy: Prior Outpatient Therapy: No Prior Therapy Dates: Reports of none Prior Therapy Facilty/Provider(s): Reports of none Reason for Treatment: Reports of none Does patient have an ACCT team?: No Does patient have Intensive In-House Services?  : No Does patient have [facility] services? : No Does patient have P4CC services?: No  Past Medical History:  Past Medical History:  Diagnosis Date  . Hypertension    History reviewed. No pertinent surgical history. Family History: History reviewed. No pertinent family history. Family Psychiatric  History: Does not know of any family history of mental illness although she made mention of her siblings all being "out in the street" Social History:  History  Alcohol Use No     History  Drug Use No    Social History   Social History  . Marital status: Single    Spouse name: N/A  . Number of children: N/A  . Years of education: N/A   Social History Main Topics  . Smoking status: Never Smoker  . Smokeless tobacco: Never Used  . Alcohol use No  . Drug use: No  . Sexual activity: Not Asked   Other Topics Concern  . None   Social History Narrative  . None   Additional Social History:    Allergies:  No Known Allergies  Labs:  Results for orders placed or performed during the hospital encounter of 03/23/17 (from the past 48 hour(s))  Comprehensive metabolic panel     Status: Abnormal   Collection Time: 03/23/17  9:28 AM  Result Value Ref Range   Sodium 139 135 - 145 mmol/L   Potassium 3.9 3.5 - 5.1 mmol/L   Chloride 106 101 - 111 mmol/L   CO2 25 22 - 32 mmol/L   Glucose, Bld 115 (H) 65 - 99 mg/dL   BUN 10 6 - 20 mg/dL   Creatinine, Ser 0.74 0.44 - 1.00 mg/dL   Calcium 9.2 8.9 - 10.3 mg/dL   Total Protein 8.2 (H) 6.5 - 8.1 g/dL   Albumin 3.7 3.5 - 5.0 g/dL   AST 19 15  - 41 U/L   ALT 16 14 - 54 U/L   Alkaline Phosphatase 110 38 - 126 U/L   Total Bilirubin 0.6 0.3 - 1.2 mg/dL   GFR calc non Af Amer >60 >60 mL/min   GFR calc Af Amer >60 >60 mL/min    Comment: (NOTE) The eGFR has been calculated using the CKD EPI equation. This calculation has not been validated in all clinical situations. eGFR's persistently <60 mL/min signify possible Chronic Kidney Disease.    Anion gap 8 5 - 15  Ethanol     Status: None   Collection Time: 03/23/17  9:28 AM  Result Value Ref Range   Alcohol, Ethyl (B) <5 <5 mg/dL    Comment:        LOWEST DETECTABLE LIMIT FOR SERUM ALCOHOL IS 5 mg/dL FOR MEDICAL PURPOSES ONLY   Salicylate level     Status: None   Collection Time: 03/23/17  9:28 AM  Result Value Ref Range  Salicylate Lvl <9.4 2.8 - 30.0 mg/dL  Acetaminophen level     Status: Abnormal   Collection Time: 03/23/17  9:28 AM  Result Value Ref Range   Acetaminophen (Tylenol), Serum <10 (L) 10 - 30 ug/mL    Comment:        THERAPEUTIC CONCENTRATIONS VARY SIGNIFICANTLY. A RANGE OF 10-30 ug/mL MAY BE AN EFFECTIVE CONCENTRATION FOR MANY PATIENTS. HOWEVER, SOME ARE BEST TREATED AT CONCENTRATIONS OUTSIDE THIS RANGE. ACETAMINOPHEN CONCENTRATIONS >150 ug/mL AT 4 HOURS AFTER INGESTION AND >50 ug/mL AT 12 HOURS AFTER INGESTION ARE OFTEN ASSOCIATED WITH TOXIC REACTIONS.   cbc     Status: Abnormal   Collection Time: 03/23/17  9:28 AM  Result Value Ref Range   WBC 8.0 3.6 - 11.0 K/uL   RBC 4.86 3.80 - 5.20 MIL/uL   Hemoglobin 11.7 (L) 12.0 - 16.0 g/dL   HCT 35.1 35.0 - 47.0 %   MCV 72.2 (L) 80.0 - 100.0 fL   MCH 24.1 (L) 26.0 - 34.0 pg   MCHC 33.4 32.0 - 36.0 g/dL   RDW 17.8 (H) 11.5 - 14.5 %   Platelets 318 150 - 440 K/uL  Urine Drug Screen, Qualitative     Status: None   Collection Time: 03/23/17  9:28 AM  Result Value Ref Range   Tricyclic, Ur Screen NONE DETECTED NONE DETECTED   Amphetamines, Ur Screen NONE DETECTED NONE DETECTED   MDMA (Ecstasy)Ur  Screen NONE DETECTED NONE DETECTED   Cocaine Metabolite,Ur [place] NONE DETECTED NONE DETECTED   Opiate, Ur Screen NONE DETECTED NONE DETECTED   Phencyclidine (PCP) Ur S NONE DETECTED NONE DETECTED   Cannabinoid 50 Ng, Ur [place] NONE DETECTED NONE DETECTED   Barbiturates, Ur Screen NONE DETECTED NONE DETECTED   Benzodiazepine, Ur Scrn NONE DETECTED NONE DETECTED   Methadone Scn, Ur NONE DETECTED NONE DETECTED    Comment: (NOTE) 854  Tricyclics, urine               Cutoff 1000 ng/mL 200  Amphetamines, urine             Cutoff 1000 ng/mL 300  MDMA (Ecstasy), urine           Cutoff 500 ng/mL 400  Cocaine Metabolite, urine       Cutoff 300 ng/mL 500  Opiate, urine                   Cutoff 300 ng/mL 600  Phencyclidine (PCP), urine      Cutoff 25 ng/mL 700  Cannabinoid, urine              Cutoff 50 ng/mL 800  Barbiturates, urine             Cutoff 200 ng/mL 900  Benzodiazepine, urine           Cutoff 200 ng/mL 1000 Methadone, urine                Cutoff 300 ng/mL 1100 1200 The urine drug screen provides only a preliminary, unconfirmed 1300 analytical test result and should not be used for non-medical 1400 purposes. Clinical consideration and professional judgment should 1500 be applied to any positive drug screen result due to possible 1600 interfering substances. A more specific alternate chemical method 1700 must be used in order to obtain a confirmed analytical result.  1800 Gas chromato graphy / mass spectrometry (GC/MS) is the preferred 1900 confirmatory method.   Pregnancy, urine POC     Status: None  Collection Time: 03/23/17  9:58 AM  Result Value Ref Range   Preg Test, Ur NEGATIVE NEGATIVE    Comment:        THE SENSITIVITY OF THIS METHODOLOGY IS >24 mIU/mL     No current facility-administered medications for this encounter.    Current Outpatient Prescriptions  Medication Sig Dispense Refill  . fluticasone (FLONASE) 50 MCG/ACT nasal spray Place 2 sprays into both nostrils  daily. (Patient not taking: Reported on 03/23/2017) 16 g 0  . lactulose (CHRONULAC) 10 GM/15ML solution Take 30 mLs (20 g total) by mouth daily as needed for mild constipation. (Patient not taking: Reported on 03/23/2017) 120 mL 0    Musculoskeletal: Strength & Muscle Tone: within normal limits Gait & Station: normal Patient leans: N/A  Psychiatric Specialty Exam: Physical Exam  Nursing note and vitals reviewed. Constitutional: She appears well-developed and well-nourished.  HENT:  Head: Normocephalic and atraumatic.  Eyes: Pupils are equal, round, and reactive to light. Conjunctivae are normal.  Neck: Normal range of motion.  Cardiovascular: Regular rhythm and normal heart sounds.   Respiratory: Effort normal. No respiratory distress.  GI: Soft.  Musculoskeletal: Normal range of motion.  Neurological: She is alert.  Skin: Skin is warm and dry.  Psychiatric: She has a normal mood and affect. Her speech is normal and behavior is normal. Judgment and thought content normal. Cognition and memory are normal.    Review of Systems  Constitutional: Negative.   HENT: Negative.   Eyes: Negative.   Respiratory: Negative.   Cardiovascular: Negative.   Gastrointestinal: Negative.   Musculoskeletal: Negative.   Skin: Negative.   Neurological: Negative.   Psychiatric/Behavioral: Negative for depression, hallucinations, memory loss, substance abuse and suicidal ideas. The patient is nervous/anxious and has insomnia.     Blood pressure (!) 154/93, pulse 81, temperature 98.9 F (37.2 C), temperature source Oral, resp. rate 18, height 5' 7"  (1.702 m), weight (!) 154.7 kg (341 lb), last menstrual period 03/05/2017, SpO2 95 %, unknown if currently breastfeeding.Body mass index is 53.41 kg/m.  General Appearance: Fairly Groomed  Eye Contact:  Good  Speech:  Clear and Coherent  Volume:  Normal  Mood:  Euthymic  Affect:  Appropriate  Thought Process:  Goal Directed  Orientation:  Full (Time,  Place, and Person)  Thought Content:  Logical  Suicidal Thoughts:  No  Homicidal Thoughts:  No  Memory:  Immediate;   Good Recent;   Fair Remote;   Fair  Judgement:  Fair  Insight:  Fair  Psychomotor Activity:  Normal  Concentration:  Concentration: Fair  Recall:  [employer] of Knowledge:  Fair  Language:  Fair  Akathisia:  No  Handed:  Right  AIMS (if indicated):     Assets:  Communication Skills Desire for Improvement Housing Physical Health Resilience  ADL's:  Intact  Cognition:  WNL  Sleep:        Treatment Plan Summary: Plan 26 year old woman who presents now as being calm lucid and appropriate with reasonably good insight. When she came in this morning it sounds like she was more distraught and tearful but now she completely denies any suicidal or homicidal thoughts. Patient is not under involuntary commitment and does not appear to meet commitment criteria. She is requesting to be released from the emergency room. Patient has been counseled to start finding ways to take care of herself and her stress. She suggests that perhaps she will find a way to get the infant into foster care or  some other kind of care because that is really overwhelming her right now. She says she will talk with her grandfather who is her closest support. She is being referred to [facility] and encouraged to get outpatient therapy. Case reviewed with emergency room physician and TTS. Patient is going to be discharged from the emergency room. No clear indication for new prescriptions.  Disposition: Patient does not meet criteria for psychiatric inpatient admission. Supportive therapy provided about ongoing stressors. Discussed crisis plan, support from social network, calling 911, coming to the Emergency Department, and calling Suicide Hotline.  Alethia Berthold, MD 03/23/2017 2:00 PM

## 2017-03-23 NOTE — ED Provider Notes (Signed)
[facility] Emergency Department Provider Note ____________________________________________   I have reviewed the triage vital signs and the triage nursing note.  HISTORY  Chief Complaint Suicidal   Historian Patient  HPI Vanessa Conner is a 26 y.o. 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  States that she's had some mild depressed mood and anxiety, but has never been diagnosed with psychiatric illness in the past.  Here she is bright and interactive and states that she would rather go see her primary care doctor and feels like things are better under control. She is voluntary to be seen by the behavioral health counselor as well as the psychiatrist today.    Past Medical History:  Diagnosis Date  . Hypertension     There are no active problems to display for this patient.   History reviewed. No pertinent surgical history.  Prior to Admission medications   Medication Sig Start Date End Date Taking? Authorizing Provider  fluticasone (FLONASE) 50 MCG/ACT nasal spray Place 2 sprays into both nostrils daily. Patient not taking: Reported on 03/23/2017 12/20/16 12/20/17  Tommi RumpsSummers, Rhonda L, PA-C  lactulose (CHRONULAC) 10 GM/15ML solution Take 30 mLs (20 g total) by mouth daily as needed for mild constipation. Patient not taking: Reported on 03/23/2017 09/05/15   Irean HongSung, Jade J, MD    No Known Allergies  History reviewed. No pertinent family history.  Social History Social History  Substance Use Topics  . Smoking status: Never Smoker  . Smokeless tobacco: Never Used  . Alcohol use No    Review of  Systems  Constitutional: Negative for fever. Eyes: Negative for visual changes. ENT: Negative for sore throat. Cardiovascular: Negative for chest pain. Respiratory: Negative for shortness of breath. Gastrointestinal: Negative for abdominal pain, vomiting and diarrhea. Genitourinary: Negative for dysuria. Musculoskeletal: Negative for back pain. Skin: Negative for rash. Neurological: Negative for headache.  ____________________________________________   PHYSICAL EXAM:  VITAL SIGNS: ED Triage Vitals  Enc Vitals Group     BP 03/23/17 0928 (!) 154/93     Pulse Rate 03/23/17 0928 81     Resp 03/23/17 0928 18     Temp 03/23/17 0928 98.9 F (37.2 C)     Temp Source 03/23/17 0928 Oral     SpO2 03/23/17 0928 95 %     Weight 03/23/17 0926 (!) 341 lb (154.7 kg)     Height 03/23/17 0926 5\' 7"  (1.702 m)     Head Circumference --      Peak Flow --      Pain Score --      Pain Loc --      Pain Edu? --      Excl. in GC? --      Constitutional: Alert and oriented. Well appearing and in no distress. HEENT   Head: Normocephalic and atraumatic.      Eyes: Conjunctivae are normal. Pupils equal and round.       Ears:         Nose: No congestion/rhinnorhea.   Mouth/Throat: Mucous membranes are moist.   Neck: No stridor. Cardiovascular/Chest:  Normal rate, regular rhythm.  No murmurs, rubs, or gallops. Respiratory: Normal respiratory effort without tachypnea nor retractions. Breath sounds are clear and equal bilaterally. No wheezes/rales/rhonchi. Gastrointestinal: Soft. No distention, no guarding, no rebound. Nontender.    Genitourinary/rectal:Deferred Musculoskeletal: Nontender with normal range of motion in all extremities. No joint effusions.  No lower extremity tenderness.  No edema. Neurologic:  Normal speech and language. No gross or focal neurologic deficits are appreciated. Skin:  Skin is warm, dry and intact. No rash noted. Psychiatric: reports some depressed mood, and  feeling of overwhelming stress, but currently feels okay and denies any active suicidal or homocidal ideation.   ____________________________________________  LABS (pertinent positives/negatives)  Labs Reviewed  COMPREHENSIVE METABOLIC PANEL - Abnormal; Notable for the following:       Result Value   Glucose, Bld 115 (*)    Total Protein 8.2 (*)    All other components within normal limits  ACETAMINOPHEN LEVEL - Abnormal; Notable for the following:    Acetaminophen (Tylenol), Serum <10 (*)    All other components within normal limits  CBC - Abnormal; Notable for the following:    Hemoglobin 11.7 (*)    MCV 72.2 (*)    MCH 24.1 (*)    RDW 17.8 (*)    All other components within normal limits  ETHANOL  SALICYLATE LEVEL  URINE DRUG SCREEN, QUALITATIVE ([facility] ONLY)  POC URINE PREG, ED  POCT PREGNANCY, URINE    ____________________________________________    EKG I, Governor Rooks, MD, the attending physician have personally viewed and interpreted all ECGs.  None ____________________________________________  RADIOLOGY All Xrays were viewed by me. Imaging interpreted by Radiologist.  None __________________________________________  PROCEDURES  Procedure(s) performed: None  Critical Care performed: None  ____________________________________________   ED COURSE / ASSESSMENT AND PLAN  Pertinent labs & imaging results that were available during my care of the patient were reviewed by me and considered in my medical decision making (see chart for details).    Ms. Grounds is a bright and interactive when I came to see her. She states that she was just overwhelmed and had a "breakdown"moment this morning. She states that she understands that she is under a lot of stress right now and both at down to her job, as well as caring for an infant. She states that she is released up with her physician and would like to follow up at the [facility]. I did discuss with her  that we can go ahead and have her see behavioralmedicine and psychiatrist here to get the ball rolling.  Patient was seen by psychiatrist Dr. Toni Amend and recommended for outpatient follow-up, no medications at this point in time.  Patient again looks bright and interactive and states no active or ongoing depression or suicidal thoughts or homicidal thoughts.    CONSULTATIONS:  TTS, Calvin from behavioral medicine consult patient and offered resources. Dr. Toni Amend, psychiatrist, saw the patient and does not recommend initiating any medications, and recommend outpatient resources and follow-up, no indication for involuntary commitment or emergency hospitalization at this point in time.  Patient / Family / Caregiver informed of clinical course, medical decision-making process, and agree with plan.   I discussed return precautions, follow-up instructions, and discharge instructions with patient and/or family.  Discharge Instructions : You were evaluated in the emergency department for depression and thoughts of hurting yourself and others, and were evaluated by Dr. Toni Amend, the psychiatrist, and it is recommended for you to follow up with a outpatient  psychiatrist as well as a counselor/therapist, as well as your primary care doctor. Return to the emergency department immediately for any worsening condition including worsening depression, any suicidal thoughts, thoughts of hurting others, or any other symptoms concerning to you. ___________________________________________   FINAL CLINICAL IMPRESSION(S) / ED DIAGNOSES   Final diagnoses:  Suicidal thoughts  Depression, unspecified depression type              Note: This dictation was prepared with Dragon dictation. Any transcriptional errors that result from this process are unintentional    Governor Rooks, MD 03/23/17 1326

## 2017-03-23 NOTE — ED Triage Notes (Signed)
Brought in via ems from home   States she is having some sucidial thoughts  Slightly tearful on arrival

## 2017-03-23 NOTE — ED Notes (Signed)
Discussed discharge instructions, prescriptions, and follow-up care with patient. No questions or concerns at this time. Pt stable at discharge.  

## 2017-03-23 NOTE — ED Notes (Signed)
Patient have a visitor  in room at this time .

## 2017-03-23 NOTE — Discharge Instructions (Signed)
You were evaluated in the emergency department for depression and thoughts of hurting yourself and others, and were evaluated by Dr. Toni Amendlapacs, the psychiatrist, and it is recommended for you to follow up with a outpatient psychiatrist as well as a counselor/therapist, as well as your primary care doctor. Return to the emergency department immediately for any worsening condition including worsening depression, any suicidal thoughts, thoughts of hurting others, or any other symptoms concerning to you.

## 2017-03-23 NOTE — ED Notes (Signed)
Patient was given her belongings , patient in room getting dressed.

## 2017-03-23 NOTE — ED Notes (Signed)
Patient came back form triage with facial jewelry , patient and myself tried to take out, but unable will notified nurse Katie N.

## 2017-04-15 ENCOUNTER — Emergency Department
Admission: EM | Admit: 2017-04-15 | Discharge: 2017-04-15 | Disposition: A | Discharge status: Home / Self Care | Payer: BLUE CROSS/BLUE SHIELD | Attending: Emergency Medicine | Admitting: Emergency Medicine

## 2017-04-15 ENCOUNTER — Encounter: Payer: Self-pay | Admitting: Emergency Medicine

## 2017-04-15 DIAGNOSIS — N898 Other specified noninflammatory disorders of vagina: Secondary | ICD-10-CM | POA: Diagnosis not present

## 2017-04-15 DIAGNOSIS — N3090 Cystitis, unspecified without hematuria: Secondary | ICD-10-CM | POA: Diagnosis not present

## 2017-04-15 DIAGNOSIS — I10 Essential (primary) hypertension: Secondary | ICD-10-CM | POA: Diagnosis not present

## 2017-04-15 DIAGNOSIS — R1030 Lower abdominal pain, unspecified: Secondary | ICD-10-CM | POA: Diagnosis present

## 2017-04-15 DIAGNOSIS — N3000 Acute cystitis without hematuria: Secondary | ICD-10-CM

## 2017-04-15 LAB — COMPREHENSIVE METABOLIC PANEL
ALK PHOS: 110 U/L (ref 38–126)
ALT: 13 U/L — AB (ref 14–54)
AST: 20 U/L (ref 15–41)
Albumin: 3.4 g/dL — ABNORMAL LOW (ref 3.5–5.0)
Anion gap: 8 (ref 5–15)
BILIRUBIN TOTAL: 0.3 mg/dL (ref 0.3–1.2)
BUN: 12 mg/dL (ref 6–20)
CALCIUM: 8.9 mg/dL (ref 8.9–10.3)
CHLORIDE: 108 mmol/L (ref 101–111)
CO2: 24 mmol/L (ref 22–32)
CREATININE: 0.88 mg/dL (ref 0.44–1.00)
Glucose, Bld: 110 mg/dL — ABNORMAL HIGH (ref 65–99)
Potassium: 3.7 mmol/L (ref 3.5–5.1)
Sodium: 140 mmol/L (ref 135–145)
TOTAL PROTEIN: 7.4 g/dL (ref 6.5–8.1)

## 2017-04-15 LAB — CBC
HCT: 33.1 % — ABNORMAL LOW (ref 35.0–47.0)
Hemoglobin: 11 g/dL — ABNORMAL LOW (ref 12.0–16.0)
MCH: 23.8 pg — ABNORMAL LOW (ref 26.0–34.0)
MCHC: 33.2 g/dL (ref 32.0–36.0)
MCV: 71.5 fL — AB (ref 80.0–100.0)
PLATELETS: 302 10*3/uL (ref 150–440)
RBC: 4.63 MIL/uL (ref 3.80–5.20)
RDW: 17.7 % — ABNORMAL HIGH (ref 11.5–14.5)
WBC: 9.8 10*3/uL (ref 3.6–11.0)

## 2017-04-15 LAB — URINALYSIS, COMPLETE (UACMP) WITH MICROSCOPIC
BILIRUBIN URINE: NEGATIVE
GLUCOSE, UA: NEGATIVE mg/dL
KETONES UR: NEGATIVE mg/dL
NITRITE: NEGATIVE
PROTEIN: NEGATIVE mg/dL
Specific Gravity, Urine: 1.014 (ref 1.005–1.030)
pH: 5 (ref 5.0–8.0)

## 2017-04-15 LAB — WET PREP, GENITAL
Clue Cells Wet Prep HPF POC: NONE SEEN
Sperm: NONE SEEN
Trich, Wet Prep: NONE SEEN
Yeast Wet Prep HPF POC: NONE SEEN

## 2017-04-15 LAB — CHLAMYDIA/NGC RT PCR (ARMC ONLY)
Chlamydia Tr: NOT DETECTED
N gonorrhoeae: NOT DETECTED

## 2017-04-15 LAB — POCT PREGNANCY, URINE: Preg Test, Ur: NEGATIVE

## 2017-04-15 LAB — LIPASE, BLOOD: Lipase: 27 U/L (ref 11–51)

## 2017-04-15 MED ORDER — CEPHALEXIN 500 MG PO CAPS: 500.0000 mg | ORAL_CAPSULE | Freq: Two times a day (BID) | ORAL | 0 refills | Status: AC

## 2017-04-15 NOTE — ED Notes (Signed)
MD Brown at bedside.

## 2017-04-15 NOTE — ED Triage Notes (Signed)
Pt ambulatory to triage in NAD, report lower abd pain described as cramping x 1 week, with nausea w/o vomiting, reports vaginal discharge as well.
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
## 2018-12-26 ENCOUNTER — Other Ambulatory Visit: Payer: Self-pay

## 2018-12-26 ENCOUNTER — Ambulatory Visit
Admission: EM | Admit: 2018-12-26 | Discharge: 2018-12-26 | Disposition: A | Discharge status: Home / Self Care | Payer: BLUE CROSS/BLUE SHIELD | Attending: Family Medicine | Admitting: Family Medicine

## 2018-12-26 ENCOUNTER — Encounter: Payer: Self-pay | Admitting: Emergency Medicine

## 2018-12-26 DIAGNOSIS — N611 Abscess of the breast and nipple: Secondary | ICD-10-CM

## 2018-12-26 MED ORDER — DOXYCYCLINE HYCLATE 100 MG PO CAPS: 100.0000 mg | ORAL_CAPSULE | Freq: Two times a day (BID) | ORAL | 0 refills | Status: DC

## 2018-12-26 MED ORDER — MUPIROCIN 2 % EX OINT: 1.0000 "application " | TOPICAL_OINTMENT | Freq: Two times a day (BID) | CUTANEOUS | 0 refills | Status: AC

## 2018-12-26 NOTE — ED Triage Notes (Signed)
Patient c/o painful lump in right breast that started this morning.

## 2018-12-26 NOTE — ED Provider Notes (Signed)
[facility]    CSN: [id number] Arrival date & time: 12/26/18  1527  History   Chief Complaint Chief Complaint  Patient presents with  . Breast Mass   HPI  28 year old female presents with a painful lump in her right breast.  Patient states that she awoke this morning with a painful area on the underside of her right breast.  Patient states that pain has continued to persist. Pain is 6/10 in severity. No reports of drainage.  She does report that it feels like a lump or bump.  Tender to palpation.  No fever.  No chills.  No medications or interventions tried.  No other associated symptoms.  No other complaints.  Hx reviewed as below. Past Medical History:  Diagnosis Date  . Hypertension   Morbid obesity.  Patient Active Problem List   Diagnosis Date Noted  . 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 Authorizing Provider  losartan (COZAAR) 100 MG tablet Take 100 mg by mouth daily.   Yes [provider]  doxycycline (VIBRAMYCIN) 100 MG capsule Take 1 capsule (100 mg total) by mouth 2 (two) times daily. 12/26/18   Tommie Sams, DO  mupirocin ointment (BACTROBAN) 2 % Apply 1 application topically 2 (two) times daily for 7 days. 12/26/18 01/02/19  Tommie Sams, DO   Social History Social History   Tobacco Use  . Smoking status: Never Smoker  . Smokeless tobacco: Never Used  Substance Use Topics  . Alcohol use: No  . Drug use: No   Allergies   Amoxicillin   Review of Systems Review of Systems  Constitutional: Negative.   Breast: Breast lump; pain.  Physical Exam Triage Vital Signs ED Triage Vitals  Enc Vitals Group     BP 12/26/18  1549 136/74     Pulse Rate 12/26/18 1549 89     Resp 12/26/18 1549 18     Temp 12/26/18 1549 98.4 F (36.9 C)     Temp Source 12/26/18 1549 Oral     SpO2 12/26/18 1549 98 %     Weight 12/26/18 1546 (!) 341 lb (154.7 kg)     Height 12/26/18 1546 5\' 6"  (1.676 m)     Head Circumference --      Peak Flow --      Pain Score 12/26/18 1546 6     Pain Loc --      Pain Edu? --      Excl. in GC? --    Updated Vital Signs BP 136/74 (BP Location: Right Arm)   Pulse 89   Temp 98.4 F (36.9 C) (Oral)   Resp 18   Ht 5\' 6"  (1.676 m)   Wt (!) 154.7 kg   LMP 12/19/2018   SpO2 98%   BMI 55.04 kg/m   Visual Acuity Right Eye Distance:   Left Eye Distance:   Bilateral Distance:  Right Eye Near:   Left Eye Near:    Bilateral Near:     Physical Exam Vitals signs and nursing note reviewed.  Constitutional:      Comments: Morbidly obese female; no acute distress.  HENT:     Head: Normocephalic and atraumatic.  Eyes:     General:        Right eye: No discharge.        Left eye: No discharge.     Conjunctiva/sclera: Conjunctivae normal.  Pulmonary:     Effort: Pulmonary effort is normal. No respiratory distress.  Chest:     Comments: Small area with a central pustule noted on the underside of the right breast.  Tender to palpation.  No drainage.  No fluctuance. Skin:    General: Skin is warm.  Psychiatric:        Mood and Affect: Mood normal.        Behavior: Behavior normal.    UC Treatments / Results  Labs (all labs ordered are listed, but only abnormal results are displayed) Labs Reviewed - No data to display  EKG None  Radiology No results found.  Procedures Procedures (including critical care time)  Medications Ordered in UC Medications - No data to display  Initial Impression / Assessment and Plan / UC Course  I have reviewed the triage vital signs and the nursing notes.  Pertinent labs & imaging results that were available during my care of the patient  were reviewed by me and considered in my medical decision making (see chart for details).    28 year old female presents with a developing breast abscess.  Treating with doxycycline and mupirocin.  Advised warm compresses.   Final Clinical Impressions(s) / UC Diagnoses   Final diagnoses:  Breast abscess   Discharge Instructions   None    ED Prescriptions    Medication Sig Dispense Auth. Provider   doxycycline (VIBRAMYCIN) 100 MG capsule Take 1 capsule (100 mg total) by mouth 2 (two) times daily. 14 capsule Kaedan Richert G, DO   mupirocin ointment (BACTROBAN) 2 % Apply 1 application topically 2 (two) times daily for 7 days. 22 g Tommie Samsook, Estella Malatesta G, DO     Controlled Substance Prescriptions [place] Controlled Substance Registry consulted? Not Applicable   Tommie SamsCook, Shafer Swamy G, DO 12/26/18 1711

## 2019-09-29 ENCOUNTER — Other Ambulatory Visit: Payer: Self-pay

## 2019-09-29 ENCOUNTER — Emergency Department: Payer: Medicaid Other

## 2019-09-29 ENCOUNTER — Emergency Department
Admission: EM | Admit: 2019-09-29 | Discharge: 2019-09-29 | Disposition: A | Discharge status: Home / Self Care | Payer: Medicaid Other | Attending: Emergency Medicine | Admitting: Emergency Medicine

## 2019-09-29 DIAGNOSIS — Z79899 Other long term (current) drug therapy: Secondary | ICD-10-CM | POA: Diagnosis not present

## 2019-09-29 DIAGNOSIS — I10 Essential (primary) hypertension: Secondary | ICD-10-CM | POA: Diagnosis not present

## 2019-09-29 DIAGNOSIS — M25561 Pain in right knee: Secondary | ICD-10-CM | POA: Diagnosis not present

## 2019-09-29 MED ORDER — IBUPROFEN 600 MG PO TABS: 600.0000 mg | ORAL_TABLET | Freq: Four times a day (QID) | ORAL | 0 refills | Status: DC | PRN

## 2019-09-29 MED ORDER — CYCLOBENZAPRINE HCL 5 MG PO TABS: ORAL_TABLET | ORAL | 0 refills | Status: DC

## 2019-09-29 MED ORDER — KETOROLAC TROMETHAMINE 30 MG/ML IJ SOLN
30.0000 mg | Freq: Once | INTRAMUSCULAR | Status: AC
  Administered 2019-09-29: 30 mg via INTRAMUSCULAR
  Filled 2019-09-29: qty 1
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
## 2019-12-11 ENCOUNTER — Emergency Department
Admission: EM | Admit: 2019-12-11 | Discharge: 2019-12-11 | Disposition: A | Discharge status: Home / Self Care | Payer: Medicaid Other | Attending: Student in an Organized Health Care Education/Training Program | Admitting: Student in an Organized Health Care Education/Training Program

## 2019-12-11 ENCOUNTER — Other Ambulatory Visit: Payer: Self-pay

## 2019-12-11 DIAGNOSIS — R21 Rash and other nonspecific skin eruption: Secondary | ICD-10-CM

## 2019-12-11 DIAGNOSIS — Z79899 Other long term (current) drug therapy: Secondary | ICD-10-CM | POA: Insufficient documentation

## 2019-12-11 DIAGNOSIS — I10 Essential (primary) hypertension: Secondary | ICD-10-CM | POA: Insufficient documentation

## 2019-12-11 MED ORDER — TRIAMCINOLONE ACETONIDE 0.1 % EX CREA: 1.0000 "application " | TOPICAL_CREAM | Freq: Four times a day (QID) | CUTANEOUS | 0 refills | Status: DC

## 2019-12-11 MED ORDER — PREDNISONE 20 MG PO TABS
60.0000 mg | ORAL_TABLET | Freq: Once | ORAL | Status: AC
  Administered 2019-12-11: 60 mg via ORAL
  Filled 2019-12-11: qty 3

## 2019-12-11 MED ORDER — PREDNISONE 50 MG PO TABS: 50.0000 mg | ORAL_TABLET | Freq: Every day | ORAL | 0 refills | Status: DC

## 2019-12-11 NOTE — ED Triage Notes (Signed)
Pt c/o fine itchy rash on her face since yesterday, states she took zyrtec and benadryl with no relief. Denies any mouth or throat swelling. Denies any new creams or lotions.

## 2019-12-11 NOTE — ED Provider Notes (Signed)
[facility] Emergency Department Provider Note  ____________________________________________  Time seen: Approximately 6:22 PM  I have reviewed the triage vital signs and the nursing notes.   HISTORY  Chief Complaint Rash    HPI Vanessa Conner is a 29 y.o. female who presents the emergency department complaining of a pruritic rash around the mouth and nose region.  Patient states that she has had no new foods, medications, soaps, shampoos, topicals or make-up.  She denies any oropharyngeal edema.  Patient states that it is very pruritic.  She has used Zyrtec with no relief.  No other complaints.  No other medications prior to arrival.         Past Medical History:  Diagnosis Date  . Hypertension     Patient Active Problem List   Diagnosis Date Noted  . 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 No pertinent surgical history.  Prior to Admission medications   Medication Sig Start Date End Date Taking? Authorizing Provider  losartan (COZAAR) 100 MG tablet Take 100 mg by mouth daily.    [provider]  predniSONE (DELTASONE) 50 MG tablet Take 1 tablet (50 mg total) by mouth daily with breakfast. 12/11/19   Lowen Mansouri, Charline Bills, PA-C  triamcinolone cream (KENALOG) 0.1 % Apply 1 application topically 4 (four) times daily. 12/11/19   Laquinton Bihm, Charline Bills, PA-C    Allergies Amoxicillin  No family history on file.  Social History Social History   Tobacco Use  . Smoking status: Never Smoker  . Smokeless tobacco: Never Used  Substance Use Topics  . Alcohol use: No  . Drug use: No     Review of Systems  Constitutional: No fever/chills Eyes: No visual changes. No discharge ENT: No upper respiratory complaints. Cardiovascular: no chest pain. Respiratory: no cough. No SOB. Gastrointestinal: No abdominal pain.  No nausea, no vomiting.  No diarrhea.  No  constipation. Musculoskeletal: Negative for musculoskeletal pain. Skin: Positive for itching and fine rash around the nose and mouth Neurological: Negative for headaches, focal weakness or numbness. 10-point ROS otherwise negative.  ____________________________________________   PHYSICAL EXAM:  VITAL SIGNS: ED Triage Vitals  Enc Vitals Group     BP 12/11/19 1759 (!) 170/92     Pulse Rate 12/11/19 1759 99     Resp 12/11/19 1759 18     Temp 12/11/19 1759 98.2 F (36.8 C)     Temp Source 12/11/19 1759 Oral     SpO2 12/11/19 1759 98 %     Weight 12/11/19 1757 (!) 350 lb (158.8 kg)     Height 12/11/19 1757 5\' 7"  (1.702 m)     Head Circumference --      Peak Flow --      Pain Score 12/11/19 1756 6     Pain Loc --      Pain Edu? --      Excl. in [place]? --      Constitutional: Alert and oriented. Well appearing and in no acute distress. Eyes: Conjunctivae are normal. PERRL. EOMI. Head: Atraumatic.  No gross facial edema or erythema.  Very fine papular rash noted around the nose, lip and chin.  No angioedema.  Rash appears consistent with milia rubra.  No hives or wheals.  No periorbital edema or erythema. ENT:      Ears:       Nose: No congestion/rhinnorhea.      Mouth/Throat: Mucous membranes are moist.  Neck: No  stridor.    Cardiovascular: Normal rate, regular rhythm. Normal S1 and S2.  Good peripheral circulation. Respiratory: Normal respiratory effort without tachypnea or retractions. Lungs CTAB. Good air entry to the bases with no decreased or absent breath sounds. Musculoskeletal: Full range of motion to all extremities. No gross deformities appreciated. Neurologic:  Normal speech and language. No gross focal neurologic deficits are appreciated.  Skin:  Skin is warm, dry and intact. No rash noted.  See above head note for skin examination of the face Psychiatric: Mood and affect are normal. Speech and behavior are normal. Patient exhibits appropriate insight and  judgement.   ____________________________________________   LABS (all labs ordered are listed, but only abnormal results are displayed)  Labs Reviewed - No data to display ____________________________________________  EKG   ____________________________________________  RADIOLOGY   No results found.  ____________________________________________    PROCEDURES  Procedure(s) performed:    Procedures    Medications  predniSONE (DELTASONE) tablet 60 mg (has no administration in time range)     ____________________________________________   INITIAL IMPRESSION / ASSESSMENT AND PLAN / ED COURSE  Pertinent labs & imaging results that were available during my care of the patient were reviewed by me and considered in my medical decision making (see chart for details).  Review of the [place] CSRS was performed in accordance of the [facility] prior to dispensing any controlled drugs.           Patient's diagnosis is consistent with rash.  Patient presented to emergency department complaining of a pruritic rash on the face.  Exam had the appearance of a milia rubra type rash though patient denied any exposure to heat recently.  Patient denied any new foods, medications or topicals.  I will treat the patient with steroids but also encouraged the patient to use a good exfoliating wash..  Patient may continue at home use of antihistamine.  Follow-up primary care as needed.  Patient is given ED precautions to return to the ED for any worsening or new symptoms.     ____________________________________________  FINAL CLINICAL IMPRESSION(S) / ED DIAGNOSES  Final diagnoses:  Rash and nonspecific skin eruption      NEW MEDICATIONS STARTED DURING THIS VISIT:  ED Discharge Orders         Ordered    predniSONE (DELTASONE) 50 MG tablet  Daily with breakfast     12/11/19 1825    triamcinolone cream (KENALOG) 0.1 %  4 times daily     12/11/19 1825              This chart  was dictated using voice recognition software/Dragon. Despite best efforts to proofread, errors can occur which can change the meaning. Any change was purely unintentional.    Racheal Patches, PA-C 12/11/19 1825    Willy Eddy, MD 12/11/19 2233

## 2019-12-11 NOTE — ED Notes (Signed)
See triage note, pt reports a rash to her face that started yesterday afternoon. Reports taking benadryl and zyrtec with no relief.  Denies throat swelling or difficulty breathing.
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
## 2020-09-06 HISTORY — PX: LAPAROSCOPIC GASTRIC SLEEVE RESECTION: SHX5895

## 2021-03-30 ENCOUNTER — Emergency Department: Payer: Medicaid Other

## 2021-03-30 ENCOUNTER — Other Ambulatory Visit: Payer: Self-pay

## 2021-03-30 DIAGNOSIS — Z79899 Other long term (current) drug therapy: Secondary | ICD-10-CM | POA: Diagnosis not present

## 2021-03-30 DIAGNOSIS — J039 Acute tonsillitis, unspecified: Secondary | ICD-10-CM | POA: Insufficient documentation

## 2021-03-30 DIAGNOSIS — R509 Fever, unspecified: Secondary | ICD-10-CM | POA: Diagnosis present

## 2021-03-30 DIAGNOSIS — I10 Essential (primary) hypertension: Secondary | ICD-10-CM | POA: Diagnosis not present

## 2021-03-30 DIAGNOSIS — Z20822 Contact with and (suspected) exposure to covid-19: Secondary | ICD-10-CM | POA: Diagnosis not present

## 2021-03-30 LAB — CBC WITH DIFFERENTIAL/PLATELET
Abs Immature Granulocytes: 0.1 10*3/uL — ABNORMAL HIGH (ref 0.00–0.07)
Basophils Absolute: 0.1 10*3/uL (ref 0.0–0.1)
Basophils Relative: 1 %
Eosinophils Absolute: 0.1 10*3/uL (ref 0.0–0.5)
Eosinophils Relative: 1 %
HCT: 36 % (ref 36.0–46.0)
Hemoglobin: 11.3 g/dL — ABNORMAL LOW (ref 12.0–15.0)
Immature Granulocytes: 1 %
Lymphocytes Relative: 13 %
Lymphs Abs: 1.9 10*3/uL (ref 0.7–4.0)
MCH: 22.7 pg — ABNORMAL LOW (ref 26.0–34.0)
MCHC: 31.4 g/dL (ref 30.0–36.0)
MCV: 72.3 fL — ABNORMAL LOW (ref 80.0–100.0)
Monocytes Absolute: 1 10*3/uL (ref 0.1–1.0)
Monocytes Relative: 7 %
Neutro Abs: 12.1 10*3/uL — ABNORMAL HIGH (ref 1.7–7.7)
Neutrophils Relative %: 77 %
Platelets: 362 10*3/uL (ref 150–400)
RBC: 4.98 MIL/uL (ref 3.87–5.11)
RDW: 19.1 % — ABNORMAL HIGH (ref 11.5–15.5)
WBC: 15.4 10*3/uL — ABNORMAL HIGH (ref 4.0–10.5)
nRBC: 0 % (ref 0.0–0.2)

## 2021-03-30 LAB — BASIC METABOLIC PANEL
Anion gap: 7 (ref 5–15)
BUN: 10 mg/dL (ref 6–20)
CO2: 26 mmol/L (ref 22–32)
Calcium: 8.6 mg/dL — ABNORMAL LOW (ref 8.9–10.3)
Chloride: 104 mmol/L (ref 98–111)
Creatinine, Ser: 1.16 mg/dL — ABNORMAL HIGH (ref 0.44–1.00)
GFR, Estimated: >60 mL/min (ref >60)
Glucose, Bld: 126 mg/dL — ABNORMAL HIGH (ref 70–99)
Potassium: 4 mmol/L (ref 3.5–5.1)
Sodium: 137 mmol/L (ref 135–145)

## 2021-03-30 LAB — TROPONIN I (HIGH SENSITIVITY): Troponin I (High Sensitivity): 3 ng/L (ref <18)

## 2021-03-30 LAB — GROUP A STREP BY PCR: Group A Strep by PCR: NOT DETECTED

## 2021-03-30 NOTE — ED Triage Notes (Signed)
Pt states sore throat and a headache that started 2 days ago. Pt states taking motrin for it with minimal relief. Pt states the majority of the pain is in her throat though. Pt also states shortness of breath with it

## 2021-03-31 ENCOUNTER — Emergency Department
Admission: EM | Admit: 2021-03-31 | Discharge: 2021-03-31 | Disposition: A | Discharge status: Home / Self Care | Payer: Medicaid Other | Attending: Emergency Medicine | Admitting: Emergency Medicine

## 2021-03-31 DIAGNOSIS — R509 Fever, unspecified: Secondary | ICD-10-CM

## 2021-03-31 DIAGNOSIS — J039 Acute tonsillitis, unspecified: Secondary | ICD-10-CM

## 2021-03-31 DIAGNOSIS — J029 Acute pharyngitis, unspecified: Secondary | ICD-10-CM

## 2021-03-31 LAB — SARS CORONAVIRUS 2 (TAT 6-24 HRS): SARS Coronavirus 2: NEGATIVE

## 2021-03-31 LAB — TROPONIN I (HIGH SENSITIVITY): Troponin I (High Sensitivity): 3 ng/L (ref <18)

## 2021-03-31 MED ORDER — DEXAMETHASONE 1 MG/ML PO CONC
10.0000 mg | Freq: Once | ORAL | Status: AC
  Administered 2021-03-31: 10 mg via ORAL
  Filled 2021-03-31: qty 10

## 2021-03-31 MED ORDER — FLUCONAZOLE 150 MG PO TABS: 150.0000 mg | ORAL_TABLET | Freq: Every day | ORAL | 0 refills | Status: DC

## 2021-03-31 MED ORDER — MAGIC MOUTHWASH
10.0000 mL | Freq: Once | ORAL | Status: AC
  Administered 2021-03-31: 10 mL via ORAL
  Filled 2021-03-31: qty 10

## 2021-03-31 MED ORDER — AMOXICILLIN-POT CLAVULANATE 875-125 MG PO TABS
1.0000 | ORAL_TABLET | Freq: Once | ORAL | Status: AC
  Administered 2021-03-31: 1 via ORAL
  Filled 2021-03-31: qty 1

## 2021-03-31 MED ORDER — MAGIC MOUTHWASH: ORAL | 0 refills | Status: DC

## 2021-03-31 MED ORDER — ACETAMINOPHEN 500 MG PO TABS
1000.0000 mg | ORAL_TABLET | Freq: Once | ORAL | Status: AC
  Administered 2021-03-31: 1000 mg via ORAL
  Filled 2021-03-31: qty 2

## 2021-03-31 MED ORDER — AMOXICILLIN-POT CLAVULANATE 875-125 MG PO TABS: 1.0000 | ORAL_TABLET | Freq: Two times a day (BID) | ORAL | 0 refills | Status: DC

## 2021-03-31 NOTE — ED Notes (Signed)
Pt c/o fever, sore throat, cough x 2 days, denies vomiting at this time. EDP Sung to triage 2 to assess patient. Pt also with noted dry cough at this time.

## 2021-03-31 NOTE — Discharge Instructions (Addendum)
1.  Take antibiotic as prescribed (Augmentin 875mg  twice daily x7 days). 2.  Alternate Tylenol and Ibuprofen every 4 hours as needed for fever greater than 100.4 F. 3.  You may use Magic mouthwash every 8 hours as needed for throat discomfort. 4.  You may take Diflucan as needed for yeast infection. 5.  Return to the ER for worsening symptoms, persistent vomiting, difficulty breathing or other concerns.

## 2021-03-31 NOTE — ED Provider Notes (Signed)
[facility] Emergency Department Provider Note   ____________________________________________   Event Date/Time   First MD Initiated Contact with Patient 03/31/21 0448     (approximate)  I have reviewed the triage vital signs and the nursing notes.   HISTORY  Chief Complaint Headache and Sore Throat    HPI Vanessa Conner is a 30 y.o. female who presents to the ED from home with a chief complaint of fever, sore throat, cough and headache x2 days.  States majority of her discomfort is sore throat.  Denies chest pain, shortness of breath, abdominal pain, nausea, vomiting or dizziness.  Patient is vaccinated but not yet boosted against COVID-19.     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  Prior to Admission medications   Medication Sig Start Date End Date Taking? Authorizing Provider  amoxicillin-clavulanate (AUGMENTIN) 875-125 MG tablet Take 1 tablet by mouth 2 (two) times daily. 03/31/21  Yes Irean Hong, MD  fluconazole (DIFLUCAN) 150 MG tablet Take 1 tablet (150 mg total) by mouth daily. 03/31/21  Yes Irean Hong, MD  magic mouthwash SOLN 85mL Anbesol 30mL Benadryl 6mL Mylanta  62mL swish, gargle & spit q8hr prn throat discomfort 03/31/21  Yes Irean Hong, MD  losartan (COZAAR) 100 MG tablet Take 100 mg by mouth daily.    [provider]  predniSONE (DELTASONE) 50 MG tablet Take 1 tablet (50 mg total) by mouth daily with breakfast. 12/11/19   Cuthriell, Delorise Royals, PA-C  triamcinolone cream (KENALOG) 0.1 % Apply 1 application topically 4 (four) times daily. 12/11/19   Cuthriell, Delorise Royals, PA-C    Allergies Amoxicillin  No family history on file.  Social History Social History   Tobacco Use   Smoking status: Never   Smokeless tobacco: Never   Vaping Use   Vaping Use: Never used  Substance Use Topics   Alcohol use: No   Drug use: No    Review of Systems  Constitutional: Positive for fever Eyes: No visual changes. ENT: Positive for sore throat. Cardiovascular: Denies chest pain. Respiratory: Positive for nonproductive cough.  Denies shortness of breath. Gastrointestinal: No abdominal pain.  No nausea, no vomiting.  No diarrhea.  No constipation. Genitourinary: Negative for dysuria. Musculoskeletal: Negative for back pain. Skin: Negative for rash. Neurological: Negative for headaches, focal weakness or numbness.   ____________________________________________   PHYSICAL EXAM:  VITAL SIGNS: ED Triage Vitals  Enc Vitals Group     BP 03/30/21 2202 (!) 164/105     Pulse Rate 03/30/21 2202 (!) 118     Resp 03/30/21 2202 20     Temp 03/30/21 2202 100.1 F (37.8 C)     Temp Source 03/31/21 0123 Oral     SpO2 03/30/21 2202 98 %     Weight --      Height --      Head Circumference --      Peak Flow --      Pain Score 03/30/21 2202 7     Pain Loc --      Pain Edu? --      Excl. in [place]? --     Constitutional: Alert and oriented. Well appearing and in mild acute distress. Eyes: Conjunctivae are normal. PERRL. EOMI. Head: Atraumatic. Nose: No congestion/rhinnorhea.  Mouth/Throat: Mucous membranes are moist.  Oropharynx moderately erythematous with bilateral and symmetrical tonsillar swelling with exudates.  No peritonsillar abscess.  There is no hoarse or muffled voice.  There is no drooling. Neck: No stridor.  Supple neck without meningismus. Hematological/Lymphatic/Immunilogical: Shotty anterior cervical lymphadenopathy. Cardiovascular: Normal rate, regular rhythm. Grossly normal heart sounds.  Good peripheral circulation. Respiratory: Normal respiratory effort.  No retractions. Lungs CTAB. Gastrointestinal: Obese. Soft and nontender to light or deep palpation. No distention. No abdominal bruits. No CVA  tenderness. Musculoskeletal: No lower extremity tenderness nor edema.  No joint effusions. Neurologic:  Normal speech and language. No gross focal neurologic deficits are appreciated. No gait instability. Skin:  Skin is warm, dry and intact. No rash noted. No petechiae. Psychiatric: Mood and affect are normal. Speech and behavior are normal.  ____________________________________________   LABS (all labs ordered are listed, but only abnormal results are displayed)  Labs Reviewed  CBC WITH DIFFERENTIAL/PLATELET - Abnormal; Notable for the following components:      Result Value   WBC 15.4 (*)    Hemoglobin 11.3 (*)    MCV 72.3 (*)    MCH 22.7 (*)    RDW 19.1 (*)    Neutro Abs 12.1 (*)    Abs Immature Granulocytes 0.10 (*)    All other components within normal limits  BASIC METABOLIC PANEL - Abnormal; Notable for the following components:   Glucose, Bld 126 (*)    Creatinine, Ser 1.16 (*)    Calcium 8.6 (*)    All other components within normal limits  GROUP A STREP BY PCR  SARS CORONAVIRUS 2 (TAT 6-24 HRS)  TROPONIN I (HIGH SENSITIVITY)  TROPONIN I (HIGH SENSITIVITY)   ____________________________________________  EKG  ED ECG REPORT I, Romero Letizia J, the attending physician, personally viewed and interpreted this ECG.   Date: 03/31/2021  EKG Time: 2222  Rate: 113  Rhythm: sinus tachycardia  Axis: Normal  Intervals:none  ST&T Change: Nonspecific  ____________________________________________  RADIOLOGY I, Joelynn Dust J, personally viewed and evaluated these images (plain radiographs) as part of my medical decision making, as well as reviewing the written report by the radiologist.  ED MD interpretation:  No acute cardiopulmonary process  Official radiology report(s): DG Chest 2 View  Result Date: 03/30/2021 CLINICAL DATA:  Sore throat and headache. EXAM: CHEST - 2 VIEW COMPARISON:  September 07, 2016 FINDINGS: The heart size and mediastinal contours are within normal  limits. Both lungs are clear. The visualized skeletal structures are unremarkable. IMPRESSION: No active cardiopulmonary disease. Electronically Signed   By: Aram Candela M.D.   On: 03/30/2021 22:45    ____________________________________________   PROCEDURES  Procedure(s) performed (including Critical Care):  Procedures   ____________________________________________   INITIAL IMPRESSION / ASSESSMENT AND PLAN / ED COURSE  As part of my medical decision making, I reviewed the following data within the electronic MEDICAL RECORD NUMBER Nursing notes reviewed and incorporated, Labs reviewed, EKG interpreted, Old chart reviewed, Radiograph reviewed, and Notes from prior ED visits     30 year old female presenting with fever, sore throat, cough. Differential diagnosis includes but is not limited to viral process, pharyngitis, bacterial infection, etc.  Lab results demonstrate moderate leukocytosis, no pneumonia on cxr. Clinical appearance consistent with tonsillitis. Will administer Decadron, Augmentin, Magic Mouthwash. Prescription for Diflucan provided as patient states she develops yeast infections while taking amoxicillin.  Patient has access to MyChart and will check her COVID results there.  Strict return precautions given.  Patient verbalizes understanding  and agrees with plan of care.      ____________________________________________   FINAL CLINICAL IMPRESSION(S) / ED DIAGNOSES  Final diagnoses:  Sore throat  Fever, unspecified fever cause  Tonsillitis     ED Discharge Orders          Ordered    amoxicillin-clavulanate (AUGMENTIN) 875-125 MG tablet  2 times daily        03/31/21 0458    magic mouthwash SOLN        03/31/21 0458    fluconazole (DIFLUCAN) 150 MG tablet  Daily        03/31/21 0459             Note:  This document was prepared using Dragon voice recognition software and may include unintentional dictation errors.    Irean Hong, MD 03/31/21  [phone]

## 2021-03-31 NOTE — ED Notes (Signed)
NAD noted at time of D/C. Pt denies questions or concerns. Pt ambulatory to the lobby at this time.  

## 2021-04-07 ENCOUNTER — Other Ambulatory Visit: Payer: Self-pay

## 2021-04-07 ENCOUNTER — Ambulatory Visit (INDEPENDENT_AMBULATORY_CARE_PROVIDER_SITE_OTHER): Payer: Medicaid Other | Admitting: Internal Medicine

## 2021-04-07 DIAGNOSIS — G4733 Obstructive sleep apnea (adult) (pediatric): Secondary | ICD-10-CM

## 2021-04-07 DIAGNOSIS — Z6841 Body Mass Index (BMI) 40.0 and over, adult: Secondary | ICD-10-CM | POA: Diagnosis not present

## 2021-04-07 DIAGNOSIS — I10 Essential (primary) hypertension: Secondary | ICD-10-CM

## 2021-04-07 NOTE — Progress Notes (Signed)
Sleep Medicine   Office Visit  Patient Name: Vanessa Conner DOB: [birth year] MRN [id number]    Chief Complaint: SS results   HISTORY OF PRESENT ILLNESS: Vanessa Conner is seen today for follow up for  initial consult for sleep study results. Does have moderate OSA and recommend APAP 9-15cm H2O. Patient goes to sleep at 11pm and wakes at 6:45pm. Patient reports snoring and stops breathing in her sleep. The patient complains of daytime sleepiness. The Epworth Sleepiness Score is 7.    ROS  General: (-) fever, (-) chills, (-) night sweat Nose and Sinuses: (-) nasal stuffiness or itchiness, (-) postnasal drip, (-) nosebleeds, (-) sinus trouble. Mouth and Throat: (-) sore throat, (-) hoarseness. Neck: (-) swollen glands, (-) enlarged thyroid, (-) neck pain. Respiratory: - cough, - shortness of breath, - wheezing. Neurologic: - numbness, - tingling. Psychiatric: - anxiety, - depression Sleep behavior: -sleep paralysis -hypnogogic hallucinations -dream enactment      -vivid dreams -cataplexy -night terrors -sleep walking   Current Medication: Outpatient Encounter Medications as of 04/07/2021  Medication Sig   amoxicillin-clavulanate (AUGMENTIN) 875-125 MG tablet Take 1 tablet by mouth 2 (two) times daily.   fluconazole (DIFLUCAN) 150 MG tablet Take 1 tablet (150 mg total) by mouth daily.   losartan-hydrochlorothiazide (HYZAAR) 100-25 MG tablet Take 1 tablet by mouth daily.   magic mouthwash SOLN 41mL Anbesol 38mL Benadryl 57mL Mylanta  60mL swish, gargle & spit q8hr prn throat discomfort   triamcinolone cream (KENALOG) 0.1 % Apply 1 application topically 4 (four) times daily.   Vitamin D, Ergocalciferol, (DRISDOL) 1.25 MG (50000 UNIT) CAPS capsule Take 50,000 Units by mouth once a week.   [DISCONTINUED] losartan (COZAAR) 100 MG tablet Take 100 mg by mouth daily.   [DISCONTINUED] predniSONE (DELTASONE) 50 MG tablet Take 1 tablet (50 mg total) by mouth daily with breakfast.   No  facility-administered encounter medications on file as of 04/07/2021.    Surgical History: History reviewed. No pertinent surgical history.  Medical History: Past Medical History:  Diagnosis Date   Hypertension     Family History: Non contributory to the present illness  Social History: Social History   Socioeconomic History   Marital status: Single    Spouse name: Not on file   Number of children: Not on file   Years of education: Not on file   Highest education level: Not on file  Occupational History   Not on file  Tobacco Use   Smoking status: Never   Smokeless tobacco: Never  Vaping Use   Vaping Use: Never used  Substance and Sexual Activity   Alcohol use: Yes   Drug use: No   Sexual activity: Not on file  Other Topics Concern   Not on file  Social History Narrative   Not on file   Social Determinants of Health   Financial Resource Strain: Not on file  Food Insecurity: Not on file  Transportation Needs: Not on file  Physical Activity: Not on file  Stress: Not on file  Social Connections: Not on file  Intimate Partner Violence: Not on file    Vital Signs: Blood pressure (!) 144/95, pulse (!) 104, temperature 97.7 F (36.5 C), resp. rate 18, height 5\' 7"  (1.702 m), weight (!) 405 lb (183.7 kg), SpO2 96 %.  Examination: General Appearance: The patient is well-developed, well-nourished, and in no distress. Neck Circumference: 47 cm Skin: Gross inspection of skin unremarkable. Head: normocephalic, no gross deformities. Eyes: no gross deformities noted. ENT: ears appear  grossly normal Neurologic: Alert and oriented. No involuntary movements.    EPWORTH SLEEPINESS SCALE:  Scale:  (0)= no chance of dozing; (1)= slight chance of dozing; (2)= moderate chance of dozing; (3)= high chance of dozing  Chance  Situtation    Sitting and reading: 0    Watching TV: 2    Sitting Inactive in public: 0    As a passenger in car: 2      Lying down to rest:  3    Sitting and talking: 0    Sitting quielty after lunch: 0    In a car, stopped in traffic: 0   TOTAL SCORE:   7 out of 24    SLEEP STUDIES:  PSG 01/24/21 AHI 20 Spo502min 85% CPAP 02/28/21   LABS: Recent Results (from the past 2160 hour(s))  CBC with Differential     Status: Abnormal   Collection Time: 03/30/21 10:13 PM  Result Value Ref Range   WBC 15.4 (H) 4.0 - 10.5 K/uL   RBC 4.98 3.87 - 5.11 MIL/uL   Hemoglobin 11.3 (L) 12.0 - 15.0 g/dL   HCT 45.436.0 09.836.0 - 11.946.0 %   MCV 72.3 (L) 80.0 - 100.0 fL   MCH 22.7 (L) 26.0 - 34.0 pg   MCHC 31.4 30.0 - 36.0 g/dL   RDW 14.719.1 (H) 82.911.5 - 56.215.5 %   Platelets 362 150 - 400 K/uL   nRBC 0.0 0.0 - 0.2 %   Neutrophils Relative % 77 %   Neutro Abs 12.1 (H) 1.7 - 7.7 K/uL   Lymphocytes Relative 13 %   Lymphs Abs 1.9 0.7 - 4.0 K/uL   Monocytes Relative 7 %   Monocytes Absolute 1.0 0.1 - 1.0 K/uL   Eosinophils Relative 1 %   Eosinophils Absolute 0.1 0.0 - 0.5 K/uL   Basophils Relative 1 %   Basophils Absolute 0.1 0.0 - 0.1 K/uL   Immature Granulocytes 1 %   Abs Immature Granulocytes 0.10 (H) 0.00 - 0.07 K/uL    Comment: Performed at [facility], [address] [place], [place] [address]  Basic metabolic panel     Status: Abnormal   Collection Time: 03/30/21 10:13 PM  Result Value Ref Range   Sodium 137 135 - 145 mmol/L   Potassium 4.0 3.5 - 5.1 mmol/L   Chloride 104 98 - 111 mmol/L   CO2 26 22 - 32 mmol/L   Glucose, Bld 126 (H) 70 - 99 mg/dL    Comment: Glucose reference range applies only to samples taken after fasting for at least 8 hours.   BUN 10 6 - 20 mg/dL   Creatinine, Ser 5.781.16 (H) 0.44 - 1.00 mg/dL   Calcium 8.6 (L) 8.9 - 10.3 mg/dL   GFR, Estimated >46>60 >96>60 mL/min    Comment: (NOTE) Calculated using the CKD-EPI Creatinine Equation (2021)    Anion gap 7 5 - 15    Comment: Performed at [facility], [address] [place], [place] [address]  Troponin I (High Sensitivity)     Status: None    Collection Time: 03/30/21 10:13 PM  Result Value Ref Range   Troponin I (High Sensitivity) 3 <18 ng/L    Comment: (NOTE) Elevated high sensitivity troponin I (hsTnI) values and significant  changes across serial measurements may suggest ACS but many other  chronic and acute conditions are known to elevate hsTnI results.  Refer to the "Links" section for chest pain algorithms and additional  guidance. Performed at [facility], [address] [place], [place] [address]   SARS CORONAVIRUS 2 (TAT 6-24 HRS) Nasopharyngeal Throat     Status: None   Collection Time: 03/30/21 10:13 PM   Specimen: Throat; Nasopharyngeal  Result Value Ref Range   SARS Coronavirus 2 NEGATIVE NEGATIVE    Comment: (NOTE) SARS-CoV-2 target nucleic acids are NOT DETECTED.  The SARS-CoV-2 RNA is generally detectable in upper and lower respiratory specimens during the acute phase of infection. Negative results do not preclude SARS-CoV-2 infection, do not rule out co-infections with other pathogens, and should not be used as the sole basis for treatment or other patient management decisions. Negative results must be combined with clinical observations, patient history, and epidemiological information. The expected result is Negative.  Fact Sheet for Patients: HairSlick.no  Fact Sheet for Healthcare Providers: quierodirigir.com  This test is not yet approved or cleared by the [place] FDA and  has been authorized for detection and/or diagnosis of SARS-CoV-2 by FDA under an Emergency Use Authorization (EUA). This EUA will remain  in effect (meaning this test can be used) for the duration of the COVID-19 declaration under Se ction 564(b)(1) of the Act, 21 U.S.C. section 360bbb-3(b)(1), unless the authorization is terminated or revoked sooner.  Performed at [facility], [address] [place], [place] [address]   Group A Strep by  PCR [facility] Only)     Status: None   Collection Time: 03/30/21 10:14 PM   Specimen: Throat; Sterile Swab  Result Value Ref Range   Group A Strep by PCR NOT DETECTED NOT DETECTED    Comment: Performed at [facility], [address] [place], [place] [address]  Troponin I (High Sensitivity)     Status: None   Collection Time: 03/31/21 12:38 AM  Result Value Ref Range   Troponin I (High Sensitivity) 3 <18 ng/L    Comment: (NOTE) Elevated high sensitivity troponin I (hsTnI) values and significant  changes across serial measurements may suggest ACS but many other  chronic and acute conditions are known to elevate hsTnI results.  Refer to the "Links" section for chest pain algorithms and additional  guidance. Performed at [facility], [address], [place], [place] [address]     Radiology: No results found.  No results found.  DG Chest 2 View  Result Date: 03/30/2021 CLINICAL DATA:  Sore throat and headache. EXAM: CHEST - 2 VIEW COMPARISON:  September 07, 2016 FINDINGS: The heart size and mediastinal contours are within normal limits. Both lungs are clear. The visualized skeletal structures are unremarkable. IMPRESSION: No active cardiopulmonary disease. Electronically Signed   By: Aram Candela M.D.   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   1. OSA (obstructive sleep apnea) APAP 9-15 cm H2O will be ordered. Follow up 30+days after set up.   2. Essential hypertension Continue current medication and f/u with PCP.   3. Morbid obesity with BMI of 60.0-69.9, adult (HCC) Obesity Counseling: Had a lengthy discussion regarding patients BMI and weight issues.  Patient was instructed on portion control as well as increased activity. Also discussed caloric restrictions with trying to maintain intake less than 2000 Kcal. Discussions  were made in accordance with the 5As of weight management. Simple actions such as not eating late and if able to, taking a walk is suggested.    General Counseling: I have discussed the findings of the evaluation and examination with Dannette.  I have also discussed any further diagnostic evaluation thatmay be needed or ordered today. Mossie verbalizes understanding of the findings of todays visit. We also reviewed her medications today and discussed drug interactions and side effects including but not limited excessive drowsiness and altered mental states. We also discussed that there is always a risk not just to her but also people around her. she has been encouraged to call the office with any questions or concerns that should arise related to todays visit.  No orders of the defined types were placed in this encounter.       I have personally obtained a history, evaluated the patient, evaluated pertinent data, formulated the assessment and plan and placed orders.  This patient was seen by Lynn Ito, PA-C in collaboration with Dr. Freda Munro as a part of collaborative care agreement.   Valentino Hue Sol Blazing, PhD, FAASM  Diplomate, American Board of Sleep Medicine    Yevonne Pax, MD [facility] Diplomate ABMS Pulmonary and Critical Care Medicine Sleep medicine

## 2021-04-07 NOTE — Patient Instructions (Signed)
Living With Sleep Apnea Sleep apnea is a condition in which breathing pauses or becomes shallow during sleep. Sleep apnea is most commonly caused by a collapsed or blocked airway. People with sleep apnea usually snore loudly. They may have times when they gasp and stop breathing for 10 seconds or more during sleep. This may happenmany times during the night. The breaks in breathing also interrupt the deep sleep that you need to feel rested. Even if you do not completely wake up from the gaps in breathing, your sleep may not be restful and you feel tired during the day. You may also have a headache in the morning and low energy during the day, and you may feel anxiousor depressed. How can sleep apnea affect me? Sleep apnea increases your chances of extreme tiredness during the day (daytime fatigue). It can also increase your risk for health conditions, such as: Heart attack. Stroke. Obesity. Type 2 diabetes. Heart failure. Irregular heartbeat. High blood pressure. If you have daytime fatigue as a result of sleep apnea, you may be more likely to: Perform poorly at school or work. Fall asleep while driving. Have difficulty with attention. Develop depression or anxiety. Have sexual dysfunction. What actions can I take to manage sleep apnea? Sleep apnea treatment  If you were given a device to open your airway while you sleep, use it only as told by your health care provider. You may be given: An oral appliance. This is a custom-made mouthpiece that shifts your lower jaw forward. A continuous positive airway pressure (CPAP) device. This device blows air through a mask when you breathe out (exhale). A nasal expiratory positive airway pressure (EPAP) device. This device has valves that you put into each nostril. A bi-level positive airway pressure (BPAP) device. This device blows air through a mask when you breathe in (inhale) and breathe out (exhale). You may need surgery if other treatments do  not work for you.  Sleep habits Go to sleep and wake up at the same time every day. This helps set your internal clock (circadian rhythm) for sleeping. If you stay up later than usual, such as on weekends, try to get up in the morning within 2 hours of your normal wake time. Try to get at least 7-9 hours of sleep each night. Stop using a computer, tablet, and mobile phone a few hours before bedtime. Do not take long naps during the day. If you nap, limit it to 30 minutes. Have a relaxing bedtime routine. Reading or listening to music may relax you and help you sleep. Use your bedroom only for sleep. Keep your television and computer out of your bedroom. Keep your bedroom cool, dark, and quiet. Use a supportive mattress and pillows. Follow your health care provider's instructions for other changes to sleep habits. Nutrition Do not eat heavy meals in the evening. Do not have caffeine in the later part of the day. The effects of caffeine can last for more than 5 hours. Follow your health care provider's or dietitian's instructions for any diet changes. Lifestyle     Do not drink alcohol before bedtime. Alcohol can cause you to fall asleep at first, but then it can cause you to wake up in the middle of the night and have trouble getting back to sleep. Do not use any products that contain nicotine or tobacco. These products include cigarettes, chewing tobacco, and vaping devices, such as e-cigarettes. If you need help quitting, ask your health care provider. Medicines Take over-the-counter   and prescription medicines only as told by your health care provider. Do not use over-the-counter sleep medicine. You can become dependent on this medicine, and it can make sleep apnea worse. Do not use medicines, such as sedatives and narcotics, unless told by your health care provider. Activity Exercise on most days, but avoid exercising in the evening. Exercising near bedtime can interfere with  sleeping. If possible, spend time outside every day. Natural light helps regulate your circadian rhythm. General information Lose weight if you need to, and maintain a healthy weight. Keep all follow-up visits. This is important. If you are having surgery, make sure to tell your health care provider that you have sleep apnea. You may need to bring your device with you. Where to find more information Learn more about sleep apnea and daytime fatigue from: American Sleep Association: sleepassociation.org National Sleep Foundation: sleepfoundation.org National Heart, Lung, and Blood Institute: nhlbi.nih.gov Summary Sleep apnea is a condition in which breathing pauses or becomes shallow during sleep. Sleep apnea can cause daytime fatigue and other serious health conditions. You may need to wear a device while sleeping to help keep your airway open. If you are having surgery, make sure to tell your health care provider that you have sleep apnea. You may need to bring your device with you. Making changes to sleep habits, diet, lifestyle, and activity can help you manage sleep apnea. This information is not intended to replace advice given to you by your health care provider. Make sure you discuss any questions you have with your healthcare provider. Document Revised: 08/01/2020 Document Reviewed: 08/01/2020 Elsevier Patient Education  2022 Elsevier Inc.  

## 2021-05-25 ENCOUNTER — Ambulatory Visit: Payer: Medicaid Other

## 2021-05-25 NOTE — Progress Notes (Signed)
No show for appointment. Office will call to reschedule.  

## 2021-05-26 ENCOUNTER — Ambulatory Visit: Payer: Medicaid Other | Admitting: Internal Medicine

## 2022-06-08 NOTE — Progress Notes (Signed)
[facility] [place], [place] [address]  Pulmonary Sleep Medicine   Office Visit Note  Patient Name: Vanessa Conner DOB: August 20, 1991 MRN [id number]    Chief Complaint: Obstructive Sleep Apnea visit  Brief History:  Vanessa Conner is seen today for an annual follow up visit for APAP@ 4-20 cmH2O. The patient has a 1 year history of sleep apnea. Patient is using PAP nightly.  The patient feels rested after sleeping with PAP.  The patient reports benefiting from PAP use. Reported sleepiness is  improved and the Epworth Sleepiness Score is 5 out of 24. The patient does not  take naps. The patient complains of the following: no complaints  The compliance download shows 84% compliance with an average use time of 6 hours 45 minutes. The AHI is 0.1.  The patient does not complain of limb movements disrupting sleep. The patient continues to require PAP therapy in order to eliminate her sleep apnea. Patient has lost 87 lbs since last year.  ROS  General: (-) fever, (-) chills, (-) night sweat Nose and Sinuses: (-) nasal stuffiness or itchiness, (-) postnasal drip, (-) nosebleeds, (-) sinus trouble. Mouth and Throat: (-) sore throat, (-) hoarseness. Neck: (-) swollen glands, (-) enlarged thyroid, (-) neck pain. Respiratory: - cough, - shortness of breath, - wheezing. Neurologic: - numbness, - tingling. Psychiatric: - anxiety, - depression   Current Medication: Outpatient Encounter Medications as of 06/09/2022  Medication Sig   famotidine (PEPCID) 20 MG tablet Take by mouth.   amoxicillin-clavulanate (AUGMENTIN) 875-125 MG tablet Take 1 tablet by mouth 2 (two) times daily.   fluconazole (DIFLUCAN) 150 MG tablet Take 1 tablet (150 mg total) by mouth daily.   losartan-hydrochlorothiazide (HYZAAR) 100-25 MG tablet Take 1 tablet by mouth daily.   magic mouthwash SOLN 22mL Anbesol 8mL Benadryl 57mL Mylanta  62mL swish, gargle & spit q8hr prn throat discomfort   omeprazole  (PRILOSEC) 20 MG capsule Take 20 mg by mouth daily.   triamcinolone cream (KENALOG) 0.1 % Apply 1 application topically 4 (four) times daily.   Vitamin D, Ergocalciferol, (DRISDOL) 1.25 MG (50000 UNIT) CAPS capsule Take 50,000 Units by mouth once a week.   No facility-administered encounter medications on file as of 06/09/2022.    Surgical History: History reviewed. No pertinent surgical history.  Medical History: Past Medical History:  Diagnosis Date   Hypertension     Family History: Non contributory to the present illness  Social History: Social History   Socioeconomic History   Marital status: Single    Spouse name: Not on file   Number of children: Not on file   Years of education: Not on file   Highest education level: Not on file  Occupational History   Not on file  Tobacco Use   Smoking status: Never   Smokeless tobacco: Never  Vaping Use   Vaping Use: Never used  Substance and Sexual Activity   Alcohol use: Yes   Drug use: No   Sexual activity: Not on file  Other Topics Concern   Not on file  Social History Narrative   Not on file   Social Determinants of Health   Financial Resource Strain: Not on file  Food Insecurity: Not on file  Transportation Needs: Not on file  Physical Activity: Not on file  Stress: Not on file  Social Connections: Not on file  Intimate Partner Violence: Not on file    Vital Signs: Blood pressure 119/74, pulse 62, resp. rate 12, height  5' 8.4" (1.737 m), weight (!) 318 lb (144.2 kg), SpO2 97 %. Body mass index is 47.79 kg/m.    Examination: General Appearance: The patient is well-developed, well-nourished, and in no distress. Neck Circumference: 41cm Skin: Gross inspection of skin unremarkable. Head: normocephalic, no gross deformities. Eyes: no gross deformities noted. ENT: ears appear grossly normal Neurologic: Alert and oriented. No involuntary movements.  STOP BANG RISK ASSESSMENT S (snore) Have you been told  that you snore?     No   T (tired) Are you often tired, fatigued, or sleepy during the day?   NO  O (obstruction) Do you stop breathing, choke, or gasp during sleep? NO   P (pressure) Do you have or are you being treated for high blood pressure? YES   B (BMI) Is your body index greater than 35 kg/m? YES   A (age) Are you 17 years old or older? NO   N (neck) Do you have a neck circumference greater than 16 inches?   YES   G (gender) Are you a female? NO   TOTAL STOP/BANG "YES" ANSWERS 3       A STOP-Bang score of 2 or less is considered low risk, and a score of 5 or more is high risk for having either moderate or severe OSA. For people who score 3 or 4, doctors may need to perform further assessment to determine how likely they are to have OSA.         EPWORTH SLEEPINESS SCALE:  Scale:  (0)= no chance of dozing; (1)= slight chance of dozing; (2)= moderate chance of dozing; (3)= high chance of dozing  Chance  Situtation    Sitting and reading: 0    Watching TV: 2    Sitting Inactive in public: 0    As a passenger in car: 2      Lying down to rest: 1    Sitting and talking: 0    Sitting quielty after lunch: 0    In a car, stopped in traffic: 0   TOTAL SCORE:   5 out of 24    SLEEP STUDIES:  PSG (01/2021) AHI 20/hr, min SpO2 85% Titration (02/2021) APAP@ 9-15 cmH2O   CPAP COMPLIANCE DATA:  Date Range: 11/19/2021-06/06/2022  Average Daily Use: 6 hours 45 minutes  Median Use: 6 hours 50 minutes  Compliance for > 4 Hours: 84%  AHI: 0.1 respiratory events per hour  Days Used: 177/200 days  Mask Leak: 13.8  95th Percentile Pressure: 7.8         LABS: No results found for this or any previous visit (from the past 2160 hour(s)).  Radiology: No results found.  No results found.  No results found.    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 The patient was reminded how to adjust mask fit and advised to change supplies regularly. The patient was also counselled on nightly use. The compliance is excellent. The AHI is 0.1. Pt continues to require cpap to treat her osa and is medically necessary.   1. OSA (obstructive sleep apnea) Continue excellent compliance  2. CPAP use counseling CPAP couseling-Discussed importance of adequate CPAP use as well as proper care and cleaning techniques of machine and all supplies.  3. Essential hypertension Continue current medication and f/u with PCP.  4. Morbid obesity with BMI of 45.0-49.9, adult [facility]) She is down 87lbs since last visit and is continuing to lose weight. May need to re-evaluate osa once at goal weight, in the meantime is on APAP that will adjust to lower pressure with weight loss as necessary Obesity Counseling: Had a lengthy discussion regarding patients BMI and weight issues. Patient was instructed on portion control as well as increased activity. Also discussed caloric restrictions with trying to maintain intake less than 2000 Kcal. Discussions were made in accordance with the 5As of weight management. Simple actions such as not eating late and if able to, taking a walk is suggested.    General Counseling: I have discussed the findings of the evaluation and examination with Lyn.  I have also discussed any further diagnostic evaluation thatmay be needed or ordered today. Elizebeth verbalizes understanding of the findings of todays visit. We also reviewed her medications today and discussed drug interactions and side effects including but not limited excessive drowsiness and altered mental states. We also discussed that there is always a risk not just to her but also people around her. she has been encouraged to call the office with any questions or concerns that should arise related to  todays visit.  No orders of the defined types were placed in this encounter.       I have personally obtained a history, examined the patient, evaluated laboratory and imaging results, formulated the assessment and plan and placed orders.  This patient was seen by Lynn Ito, PA-C in collaboration with Dr. Freda Munro as a part of collaborative care agreement.  Yevonne Pax, MD [facility] Diplomate ABMS Pulmonary Critical Care Medicine and Sleep Medicine

## 2022-06-09 ENCOUNTER — Ambulatory Visit (INDEPENDENT_AMBULATORY_CARE_PROVIDER_SITE_OTHER): Payer: Medicaid Other | Admitting: Internal Medicine

## 2022-06-09 VITALS — BP 119/74 | HR 62 | Resp 12 | Ht 68.4 in | Wt 318.0 lb

## 2022-06-09 DIAGNOSIS — Z7189 Other specified counseling: Secondary | ICD-10-CM | POA: Diagnosis not present

## 2022-06-09 DIAGNOSIS — Z6841 Body Mass Index (BMI) 40.0 and over, adult: Secondary | ICD-10-CM

## 2022-06-09 DIAGNOSIS — G4733 Obstructive sleep apnea (adult) (pediatric): Secondary | ICD-10-CM | POA: Diagnosis not present

## 2022-06-09 DIAGNOSIS — I10 Essential (primary) hypertension: Secondary | ICD-10-CM | POA: Diagnosis not present

## 2022-06-09 NOTE — Patient Instructions (Signed)

## 2022-11-09 ENCOUNTER — Other Ambulatory Visit: Payer: Self-pay

## 2022-11-09 ENCOUNTER — Emergency Department
Admission: EM | Admit: 2022-11-09 | Discharge: 2022-11-09 | Disposition: A | Discharge status: Home / Self Care | Payer: Medicaid Other | Attending: Emergency Medicine | Admitting: Emergency Medicine

## 2022-11-09 ENCOUNTER — Encounter: Payer: Self-pay | Admitting: Emergency Medicine

## 2022-11-09 DIAGNOSIS — R109 Unspecified abdominal pain: Secondary | ICD-10-CM | POA: Insufficient documentation

## 2022-11-09 DIAGNOSIS — R1030 Lower abdominal pain, unspecified: Secondary | ICD-10-CM | POA: Diagnosis present

## 2022-11-09 LAB — URINALYSIS, ROUTINE W REFLEX MICROSCOPIC
Bilirubin Urine: NEGATIVE
Glucose, UA: NEGATIVE mg/dL
Hgb urine dipstick: NEGATIVE
Ketones, ur: NEGATIVE mg/dL
Nitrite: NEGATIVE
Protein, ur: NEGATIVE mg/dL
Specific Gravity, Urine: 1.011 (ref 1.005–1.030)
pH: 8 (ref 5.0–8.0)

## 2022-11-09 LAB — CBC
HCT: 36.1 % (ref 36.0–46.0)
Hemoglobin: 11.8 g/dL — ABNORMAL LOW (ref 12.0–15.0)
MCH: 25.7 pg — ABNORMAL LOW (ref 26.0–34.0)
MCHC: 32.7 g/dL (ref 30.0–36.0)
MCV: 78.5 fL — ABNORMAL LOW (ref 80.0–100.0)
Platelets: 342 10*3/uL (ref 150–400)
RBC: 4.6 MIL/uL (ref 3.87–5.11)
RDW: 16.3 % — ABNORMAL HIGH (ref 11.5–15.5)
WBC: 7 10*3/uL (ref 4.0–10.5)
nRBC: 0 % (ref 0.0–0.2)

## 2022-11-09 LAB — COMPREHENSIVE METABOLIC PANEL
ALT: 12 U/L (ref 0–44)
AST: 17 U/L (ref 15–41)
Albumin: 3.3 g/dL — ABNORMAL LOW (ref 3.5–5.0)
Alkaline Phosphatase: 98 U/L (ref 38–126)
Anion gap: 7 (ref 5–15)
BUN: 8 mg/dL (ref 6–20)
CO2: 23 mmol/L (ref 22–32)
Calcium: 8.7 mg/dL — ABNORMAL LOW (ref 8.9–10.3)
Chloride: 108 mmol/L (ref 98–111)
Creatinine, Ser: 0.75 mg/dL (ref 0.44–1.00)
GFR, Estimated: >60 mL/min (ref >60)
Glucose, Bld: 96 mg/dL (ref 70–99)
Potassium: 3.8 mmol/L (ref 3.5–5.1)
Sodium: 138 mmol/L (ref 135–145)
Total Bilirubin: 0.7 mg/dL (ref 0.3–1.2)
Total Protein: 6.8 g/dL (ref 6.5–8.1)

## 2022-11-09 LAB — LIPASE, BLOOD: Lipase: 30 U/L (ref 11–51)

## 2022-11-09 LAB — POC URINE PREG, ED: Preg Test, Ur: NEGATIVE

## 2022-11-09 NOTE — ED Provider Notes (Signed)
[facility] Provider Note  Patient Contact: 6:56 PM (approximate)   History   Abdominal Pain   HPI  Vanessa Conner is a 32 y.o. female presents to the emergency department with some lower abdominal discomfort that started last night.  Patient reports that discomfort is across her entire lower abdomen with no specific location for pain.  Pain seems to come and go. Patient reports that she had similar pain in the past several years ago and ended up having a urinary tract infection.  She denies dysuria or increased urinary frequency but has had some low back pain.  She denies nausea, vomiting or diarrhea.  Patient is eating a bag of ruffles potato chips at bedside.  No chest pain, chest tightness or shortness of breath.  She is been afebrile at home.      Physical Exam   Triage Vital Signs: ED Triage Vitals  Enc Vitals Group     BP 11/09/22 1825 (!) 124/95     Pulse Rate 11/09/22 1825 73     Resp 11/09/22 1825 16     Temp 11/09/22 1825 98.3 F (36.8 C)     Temp Source 11/09/22 1825 Oral     SpO2 11/09/22 1825 99 %     Weight 11/09/22 1824 299 lb (135.6 kg)     Height 11/09/22 1824 '5\' 7"'$  (1.702 m)     Head Circumference --      Peak Flow --      Pain Score 11/09/22 1824 7     Pain Loc --      Pain Edu? --      Excl. in [place]? --     Most recent vital signs: Vitals:   11/09/22 1825  BP: (!) 124/95  Pulse: 73  Resp: 16  Temp: 98.3 F (36.8 C)  SpO2: 99%     General: Alert and in no acute distress. Eyes:  PERRL. EOMI. Head: No acute traumatic findings ENT:      Nose: No congestion/rhinnorhea.      Mouth/Throat: Mucous membranes are moist.  Neck: No stridor. No cervical spine tenderness to palpation. Cardiovascular:  Good peripheral perfusion Respiratory: Normal respiratory effort without tachypnea or retractions. Lungs CTAB. Good air entry to the bases with no decreased or absent breath sounds. Gastrointestinal: Bowel sounds 4 quadrants.  Soft and nontender to palpation. No guarding or rigidity. No palpable masses. No distention. No CVA tenderness. Musculoskeletal: Full range of motion to all extremities.  Neurologic:  No gross focal neurologic deficits are appreciated.  Skin:   No rash noted    ED Results / Procedures / Treatments   Labs (all labs ordered are listed, but only abnormal results are displayed) Labs Reviewed  COMPREHENSIVE METABOLIC PANEL - Abnormal; Notable for the following components:      Result Value   Calcium 8.7 (*)    Albumin 3.3 (*)    All other components within normal limits  CBC - Abnormal; Notable for the following components:   Hemoglobin 11.8 (*)    MCV 78.5 (*)    MCH 25.7 (*)    RDW 16.3 (*)    All other components within normal limits  URINALYSIS, ROUTINE W REFLEX MICROSCOPIC - Abnormal; Notable for the following components:   Color, Urine YELLOW (*)    APPearance HAZY (*)    Leukocytes,Ua TRACE (*)    Bacteria, UA RARE (*)    Non Squamous Epithelial PRESENT (*)    All other components  within normal limits  LIPASE, BLOOD  POC URINE PREG, ED         PROCEDURES:  Critical Care performed: No  Procedures   MEDICATIONS ORDERED IN ED: Medications - No data to display   IMPRESSION / MDM / [place] / ED COURSE  I reviewed the triage vital signs and the nursing notes.                              Assessment and plan: Abdominal pain:  32 year old female presents to the emergency department with complaints of lower abdominal discomfort that started last night  Vital signs are reassuring at triage.  On exam, patient walked easily from chair to exam table and was able to pass a p.o. challenge with potato chips.   CBC, CMP and lipase within range.  Urinalysis not suggestive of UTI.  Urine pregnancy test was negative.  Given overall reassuring physical exam with no reproducible tenderness or guarding, do not feel that additional imaging is warranted at this time.   Recommended observation of symptoms at home over the next 24 to 48 hours and return to the emergency department if symptoms seem to be worsening.  Patient was given a work note.  FINAL CLINICAL IMPRESSION(S) / ED DIAGNOSES   Final diagnoses:  Nonspecific abdominal pain     Rx / DC Orders   ED Discharge Orders     None        Note:  This document was prepared using Dragon voice recognition software and may include unintentional dictation errors.   Vallarie Mare [place], PA-C 11/09/22 1931    Harvest Dark, MD 11/09/22 [id number]

## 2022-11-09 NOTE — ED Triage Notes (Signed)
Pt endorses lower abd pain since last night. Denies n/v/d or urinary symptoms.

## 2023-01-03 ENCOUNTER — Emergency Department
Admission: EM | Admit: 2023-01-03 | Discharge: 2023-01-03 | Disposition: A | Discharge status: Home / Self Care | Payer: Medicaid Other | Attending: Emergency Medicine | Admitting: Emergency Medicine

## 2023-01-03 ENCOUNTER — Other Ambulatory Visit: Payer: Self-pay

## 2023-01-03 DIAGNOSIS — B3731 Acute candidiasis of vulva and vagina: Secondary | ICD-10-CM | POA: Diagnosis not present

## 2023-01-03 DIAGNOSIS — L292 Pruritus vulvae: Secondary | ICD-10-CM | POA: Diagnosis present

## 2023-01-03 LAB — URINALYSIS, ROUTINE W REFLEX MICROSCOPIC
Bilirubin Urine: NEGATIVE
Glucose, UA: NEGATIVE mg/dL
Hgb urine dipstick: NEGATIVE
Ketones, ur: NEGATIVE mg/dL
Nitrite: NEGATIVE
Protein, ur: NEGATIVE mg/dL
Specific Gravity, Urine: 1.016 (ref 1.005–1.030)
pH: 5 (ref 5.0–8.0)

## 2023-01-03 LAB — WET PREP, GENITAL
Clue Cells Wet Prep HPF POC: NONE SEEN
Sperm: NONE SEEN
Trich, Wet Prep: NONE SEEN
WBC, Wet Prep HPF POC: >=10 — AB (ref <10)
Yeast Wet Prep HPF POC: NONE SEEN

## 2023-01-03 LAB — CHLAMYDIA/NGC RT PCR (ARMC ONLY)
Chlamydia Tr: NOT DETECTED
N gonorrhoeae: NOT DETECTED

## 2023-01-03 MED ORDER — FLUCONAZOLE 150 MG PO TABS: 150.0000 mg | ORAL_TABLET | Freq: Every day | ORAL | 0 refills | Status: DC

## 2023-01-03 MED ORDER — METRONIDAZOLE 500 MG PO TABS: 500.0000 mg | ORAL_TABLET | Freq: Two times a day (BID) | ORAL | 0 refills | Status: DC

## 2023-01-03 NOTE — ED Triage Notes (Signed)
Pt to ED for vaginal itching x2 days. Reports white discharge.

## 2023-01-03 NOTE — ED Provider Notes (Signed)
[facility] Provider Note    Event Date/Time   First MD Initiated Contact with Patient 01/03/23 1826     (approximate)   History   Vaginal Itching   HPI  Vanessa Conner is a 32 y.o. female  with history of hypertension and as listed in EMR presents to the emergency department for treatment and evaluation of vaginal itching x 2 days. No improvement with OTC cream. No abdominal pain or fever. White discharge but not sure if its the cream. Prior to using it, discharge was clear.      Physical Exam   Triage Vital Signs: ED Triage Vitals  Enc Vitals Group     BP 01/03/23 1634 (!) 141/90     Pulse Rate 01/03/23 1634 86     Resp 01/03/23 1633 18     Temp 01/03/23 1634 (!) 97.5 F (36.4 C)     Temp src --      SpO2 01/03/23 1634 97 %     Weight 01/03/23 1634 300 lb (136.1 kg)     Height 01/03/23 1634 5\' 7"  (1.702 m)     Head Circumference --      Peak Flow --      Pain Score 01/03/23 1634 0     Pain Loc --      Pain Edu? --      Excl. in [place]? --     Most recent vital signs: Vitals:   01/03/23 1633 01/03/23 1634  BP:  (!) 141/90  Pulse:  86  Resp: 18   Temp:  (!) 97.5 F (36.4 C)  SpO2:  97%    General: Awake, no distress.  CV:  Good peripheral perfusion.  Resp:  Normal effort.  Abd:  No distention.  Other:  Thick, white/gray substance on labia.   ED Results / Procedures / Treatments   Labs (all labs ordered are listed, but only abnormal results are displayed) Labs Reviewed  WET PREP, GENITAL - Abnormal; Notable for the following components:      Result Value   WBC, Wet Prep HPF POC >=10 (*)    All other components within normal limits  URINALYSIS, ROUTINE W REFLEX MICROSCOPIC - Abnormal; Notable for the following components:   Color, Urine YELLOW (*)    APPearance CLOUDY (*)    Leukocytes,Ua LARGE (*)    Bacteria, UA RARE (*)    All other components within normal limits  CHLAMYDIA/NGC RT PCR ([facility] ONLY)                EKG     RADIOLOGY   PROCEDURES:  Critical Care performed: No  Procedures   MEDICATIONS ORDERED IN ED:  Medications - No data to display   IMPRESSION / MDM / ASSESSMENT AND PLAN / ED COURSE   I have reviewed the triage note.  Differential diagnosis includes, but is not limited to, vaginal yeast infection, bacterial vaginosis, chlamydia, gonorrhea, trichomonas.   Patient's presentation is most consistent with acute complicated illness / injury requiring diagnostic workup.  32 year old female presents to the ER for evaluation of vaginal itching not responding to OTC medication. See HPI.  Wet prep and gc/chlamydia sent to lab.   Wet prep normal, however clinically she has a vaginal candidal infection and likely BV. Shared decision made to treat. Will send prescriptions to pharmacy.      FINAL CLINICAL IMPRESSION(S) / ED DIAGNOSES   Final diagnoses:  Vaginal candidiasis  Rx / DC Orders   ED Discharge Orders          Ordered    metroNIDAZOLE (FLAGYL) 500 MG tablet  2 times daily        01/03/23 1907    fluconazole (DIFLUCAN) 150 MG tablet  Daily        01/03/23 1907             Note:  This document was prepared using Dragon voice recognition software and may include unintentional dictation errors.   Chinita Pester, FNP 01/03/23 Windell Moment    Minna Antis, MD 01/03/23 2121

## 2023-02-03 ENCOUNTER — Emergency Department
Admission: EM | Admit: 2023-02-03 | Discharge: 2023-02-03 | Disposition: A | Discharge status: Home / Self Care | Payer: Medicaid Other | Attending: Student in an Organized Health Care Education/Training Program | Admitting: Student in an Organized Health Care Education/Training Program

## 2023-02-03 ENCOUNTER — Other Ambulatory Visit: Payer: Self-pay

## 2023-02-03 ENCOUNTER — Encounter: Payer: Self-pay | Admitting: Emergency Medicine

## 2023-02-03 DIAGNOSIS — N898 Other specified noninflammatory disorders of vagina: Secondary | ICD-10-CM

## 2023-02-03 DIAGNOSIS — L292 Pruritus vulvae: Secondary | ICD-10-CM | POA: Insufficient documentation

## 2023-02-03 LAB — URINALYSIS, ROUTINE W REFLEX MICROSCOPIC
Bilirubin Urine: NEGATIVE
Glucose, UA: NEGATIVE mg/dL
Hgb urine dipstick: NEGATIVE
Ketones, ur: NEGATIVE mg/dL
Nitrite: NEGATIVE
Protein, ur: NEGATIVE mg/dL
Specific Gravity, Urine: 1.013 (ref 1.005–1.030)
pH: 7 (ref 5.0–8.0)

## 2023-02-03 LAB — WET PREP, GENITAL
Clue Cells Wet Prep HPF POC: NONE SEEN
Sperm: NONE SEEN
Trich, Wet Prep: NONE SEEN
WBC, Wet Prep HPF POC: >=10 — AB (ref <10)
Yeast Wet Prep HPF POC: NONE SEEN

## 2023-02-03 LAB — CHLAMYDIA/NGC RT PCR (ARMC ONLY)
Chlamydia Tr: NOT DETECTED
N gonorrhoeae: NOT DETECTED

## 2023-02-03 LAB — POC URINE PREG, ED: Preg Test, Ur: NEGATIVE

## 2023-02-03 LAB — HIV ANTIBODY (ROUTINE TESTING W REFLEX): HIV Screen 4th Generation wRfx: NONREACTIVE

## 2023-02-03 MED ORDER — CEFDINIR 300 MG PO CAPS: 300.0000 mg | ORAL_CAPSULE | Freq: Two times a day (BID) | ORAL | 0 refills | Status: AC

## 2023-02-03 NOTE — ED Provider Notes (Signed)
[facility] Provider Note    Event Date/Time   First MD Initiated Contact with Patient 02/03/23 [phone]     (approximate)   History   Vaginal Itching   HPI  Vanessa Conner is a 32 y.o. female who presents today for evaluation of vaginal itching for the past 2 days.  She has not noticed any vaginal discharge.  She reports that she is sexually active with 1 female partner, they do not use protection.  She denies abdominal pain or pelvic pain.  She denies dyspareunia.  No fevers or chills.  No burning with urination.  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          Physical Exam   Triage Vital Signs: ED Triage Vitals  Enc Vitals Group     BP 02/03/23 0854 (!) 128/92     Pulse Rate 02/03/23 0853 65     Resp 02/03/23 0853 20     Temp 02/03/23 0853 98.7 F (37.1 C)     Temp src --      SpO2 02/03/23 0853 98 %     Weight 02/03/23 0853 300 lb (136.1 kg)     Height 02/03/23 0853 5\' 7"  (1.702 m)     Head Circumference --      Peak Flow --      Pain Score 02/03/23 0853 0     Pain Loc --      Pain Edu? --      Excl. in [place]? --     Most recent vital signs: Vitals:   02/03/23 0853 02/03/23 0854  BP:  (!) 128/92  Pulse: 65   Resp: 20   Temp: 98.7 F (37.1 C)   SpO2: 98%     Physical Exam Vitals and nursing note reviewed.  Constitutional:      General: Awake and alert. No acute distress.    Appearance: Normal appearance. The patient is obese.  HENT:     Head: Normocephalic and atraumatic.     Mouth: Mucous membranes are moist.  Eyes:     General: PERRL. Normal EOMs        Right eye: No discharge.        Left eye: No discharge.     Conjunctiva/sclera: Conjunctivae normal.  Cardiovascular:     Rate and Rhythm: Normal rate and regular rhythm.     Pulses: Normal pulses.  Pulmonary:     Effort: Pulmonary effort is normal. No respiratory distress.      Breath sounds: Normal breath sounds.  Abdominal:     Abdomen is soft. There is no abdominal tenderness. No rebound or guarding. No distention. Pelvic exam: Scant white discharge, normal appearing cervix.  Normal external genitalia.  No cervical motion tenderness, negative chandelier sign Musculoskeletal:        General: No swelling. Normal range of motion.     Cervical back: Normal range of motion and neck supple.  Skin:    General: Skin is warm and dry.     Capillary Refill: Capillary refill takes less than 2 seconds.     Findings: No rash.  Neurological:     Mental Status: The patient is awake and alert.      ED Results / Procedures / Treatments   Labs (all labs ordered are listed, but only abnormal results are displayed) Labs Reviewed  WET PREP, GENITAL - Abnormal; Notable for  the following components:      Result Value   WBC, Wet Prep HPF POC >=10 (*)    All other components within normal limits  URINALYSIS, ROUTINE W REFLEX MICROSCOPIC - Abnormal; Notable for the following components:   Color, Urine YELLOW (*)    APPearance HAZY (*)    Leukocytes,Ua LARGE (*)    Bacteria, UA MANY (*)    All other components within normal limits  CHLAMYDIA/NGC RT PCR ([facility] ONLY)            URINE CULTURE  RPR  HIV ANTIBODY (ROUTINE TESTING W REFLEX)  POC URINE PREG, ED     EKG     RADIOLOGY     PROCEDURES:  Critical Care performed:   Procedures   MEDICATIONS ORDERED IN ED: Medications - No data to display   IMPRESSION / MDM / ASSESSMENT AND PLAN / ED COURSE  I reviewed the triage vital signs and the nursing notes.   Differential diagnosis includes, but is not limited to, STD, yeast infection, urinary tract infection.  Patient is awake and alert, hemodynamically stable and afebrile.  She has no abdominal tenderness.  There is scant white discharge noted on pelvic exam, this was sent to the lab, and was negative for gonorrhea and chlamydia, also negative for  trichomonas and yeast and BV.  Urinalysis reveals bacteria and white blood cells, will treat this with antibiotics, though sent for culture for further testing.  Patient was advised that there are many other STDs that patient could have. Patient was advised to follow up with a primary care doctor to have the full panel of testing performed.  She understands that she can find the results of her HIV and RPR test on MyChart.  Patient was advised to not have sexual intercourse until fully and properly tested and treated. Patient was advised that his partner also needs to be tested and treated. Patient understands and agrees with plan.   Patient's presentation is most consistent with acute complicated illness / injury requiring diagnostic workup.  FINAL CLINICAL IMPRESSION(S) / ED DIAGNOSES   Final diagnoses:  Vaginal itching     Rx / DC Orders   ED Discharge Orders          Ordered    cefdinir (OMNICEF) 300 MG capsule  2 times daily        02/03/23 1135             Note:  This document was prepared using Dragon voice recognition software and may include unintentional dictation errors.   Keturah Shavers 02/03/23 1251    Willy Eddy, MD 02/03/23 1438

## 2023-02-03 NOTE — Discharge Instructions (Signed)
Your gonorrhea, chlamydia, yeast, BV, trichomonas tests were negative.  You can find the results of your HIV and syphilis tests on mychart.  Your urine does reveal bacteria and white blood cells, you were started on antibiotics.  Please take these for the next week.  Please return for any new, worsening, or change in symptoms or other concerns.

## 2023-02-03 NOTE — ED Triage Notes (Signed)
Pt to ED for vaginal itching x 2 days. Denies discharge.

## 2023-02-03 NOTE — ED Notes (Signed)
See triage note  Presents with some vaginal itching for a few days   Also having some dysuria  Denies any vaginal discharge  No fever

## 2023-02-04 LAB — RPR: RPR Ser Ql: NONREACTIVE

## 2023-02-05 LAB — URINE CULTURE: Culture: >=100000 — AB

## 2023-02-23 ENCOUNTER — Other Ambulatory Visit: Payer: Self-pay

## 2023-02-23 ENCOUNTER — Encounter: Payer: Self-pay | Admitting: Emergency Medicine

## 2023-02-23 ENCOUNTER — Emergency Department
Admission: EM | Admit: 2023-02-23 | Discharge: 2023-02-23 | Disposition: A | Discharge status: Home / Self Care | Payer: Medicaid Other | Attending: Emergency Medicine | Admitting: Emergency Medicine

## 2023-02-23 DIAGNOSIS — A599 Trichomoniasis, unspecified: Secondary | ICD-10-CM | POA: Diagnosis not present

## 2023-02-23 DIAGNOSIS — L292 Pruritus vulvae: Secondary | ICD-10-CM | POA: Diagnosis present

## 2023-02-23 LAB — URINALYSIS, COMPLETE (UACMP) WITH MICROSCOPIC
Bilirubin Urine: NEGATIVE
Glucose, UA: NEGATIVE mg/dL
Hgb urine dipstick: NEGATIVE
Ketones, ur: NEGATIVE mg/dL
Nitrite: NEGATIVE
Protein, ur: NEGATIVE mg/dL
Specific Gravity, Urine: 1.014 (ref 1.005–1.030)
pH: 6 (ref 5.0–8.0)

## 2023-02-23 LAB — WET PREP, GENITAL
Clue Cells Wet Prep HPF POC: NONE SEEN
Sperm: NONE SEEN
WBC, Wet Prep HPF POC: >=10 — AB (ref <10)
Yeast Wet Prep HPF POC: NONE SEEN

## 2023-02-23 LAB — CHLAMYDIA/NGC RT PCR (ARMC ONLY)
Chlamydia Tr: NOT DETECTED
N gonorrhoeae: NOT DETECTED

## 2023-02-23 MED ORDER — FLUCONAZOLE 150 MG PO TABS: 150.0000 mg | ORAL_TABLET | Freq: Every day | ORAL | 0 refills | Status: DC

## 2023-02-23 MED ORDER — METRONIDAZOLE 500 MG PO TABS: 500.0000 mg | ORAL_TABLET | Freq: Two times a day (BID) | ORAL | 0 refills | Status: AC

## 2023-02-23 NOTE — ED Triage Notes (Signed)
Pt to ED via POV for vaginal itching. Pt states that symptoms have been going on for a few weeks. Pt states that she was seen recently for same, gave medication for possible UTI. Pt states that she has taken all the medication and symptoms have not gotten any better. Pt is in NAD.

## 2023-02-23 NOTE — ED Provider Notes (Signed)
[facility] Provider Note    Event Date/Time   First MD Initiated Contact with Patient 02/23/23 1148     (approximate)   History   Vaginal Itching   HPI  Vanessa Conner is a 32 y.o. female who presents for evaluation of vaginal itching.  Patient has been seen by this ED 2 times for similar complaint and states that despite completing antibiotic treatment she has not had an improvement in her symptoms.  Patient denies urinary urgency, frequency and burning with urination.  She has not noticed any abnormal discharge and denies pelvic pain, back pain and fevers.  Patient does endorse dyspareunia.  She is sexually active with 1 female partner.  Patient denies any new soaps, lotions and does not douche.       Physical Exam   Triage Vital Signs: ED Triage Vitals  Enc Vitals Group     BP 02/23/23 1137 131/82     Pulse Rate 02/23/23 1137 71     Resp 02/23/23 1137 16     Temp 02/23/23 1137 98.5 F (36.9 C)     Temp Source 02/23/23 1137 Oral     SpO2 02/23/23 1137 99 %     Weight 02/23/23 1138 299 lb (135.6 kg)     Height 02/23/23 1138 5\' 7"  (1.702 m)     Head Circumference --      Peak Flow --      Pain Score 02/23/23 1137 0     Pain Loc --      Pain Edu? --      Excl. in GC? --     Most recent vital signs: Vitals:   02/23/23 1137  BP: 131/82  Pulse: 71  Resp: 16  Temp: 98.5 F (36.9 C)  SpO2: 99%     General: Awake, no distress.  CV:  Good peripheral perfusion.  Resp:  Normal effort.  Abd:  No distention.  Pelvic exam:  normal external genitalia, vulva, vagina, cervix, uterus and adnexa, VULVA: normal appearing vulva with no masses, tenderness or lesions, VAGINA: normal appearing vagina with normal color and discharge, no lesions, vaginal discharge - milky, CERVIX: normal appearing cervix without discharge or lesions, UTERUS: uterus is normal size, shape, consistency and nontender, ADNEXA: normal adnexa in size, nontender and no masses,  exam chaperoned by nurse. No cervical motion tenderness.      ED Results / Procedures / Treatments   Labs (all labs ordered are listed, but only abnormal results are displayed) Labs Reviewed  WET PREP, GENITAL - Abnormal; Notable for the following components:      Result Value   Trich, Wet Prep PRESENT (*)    WBC, Wet Prep HPF POC >=10 (*)    All other components within normal limits  URINALYSIS, COMPLETE (UACMP) WITH MICROSCOPIC - Abnormal; Notable for the following components:   Color, Urine YELLOW (*)    APPearance HAZY (*)    Leukocytes,Ua LARGE (*)    Bacteria, UA FEW (*)    All other components within normal limits  CHLAMYDIA/NGC RT PCR ([facility] ONLY)            POC URINE PREG, ED    PROCEDURES:  Critical Care performed: No  Procedures   MEDICATIONS ORDERED IN ED: Medications - No data to display   IMPRESSION / MDM / ASSESSMENT AND PLAN / ED COURSE  I reviewed the triage vital signs and the nursing notes.  Differential diagnosis includes, but is not limited to, yeast infection, BV, trichomonas, gonorrhea, chlamydia.  Patient's presentation is most consistent with acute complicated illness / injury requiring diagnostic workup.  Patient presented to the ED with vaginal itching ongoing for few weeks.  Patient was seen and treated 2 months ago for a yeast infection and then 1 month ago for UTI.  She has completed her antibiotics and has not had resolution of her symptoms.  Patient had a normal pelvic exam aside from some vaginal discharge.  Gonorrhea/chlamydia test and wet mount were obtained.  Evidence of trichomonas on wet mount.  UA did show leukocytes WBCs and bacteria.  Since patient is not reporting urinary symptoms I believe this is due to the trichomonas infection.  Patient will be given a course of oral antibiotics and I will also prescribe an antifungal for yeast infection prophylaxis given patient's history of yeast infections.  I  advised patient to make sure that her partner is tested and treated.  Patient voiced understanding, all questions were answered and patient was stable at discharge.     FINAL CLINICAL IMPRESSION(S) / ED DIAGNOSES   Final diagnoses:  Trichomoniasis     Rx / DC Orders   ED Discharge Orders          Ordered    metroNIDAZOLE (FLAGYL) 500 MG tablet  2 times daily        02/23/23 1456    fluconazole (DIFLUCAN) 150 MG tablet  Daily        02/23/23 1456             Note:  This document was prepared using Dragon voice recognition software and may include unintentional dictation errors.   Cameron Ali, PA-C 02/23/23 1500    Chesley Noon, MD 02/23/23 1918

## 2023-04-06 ENCOUNTER — Emergency Department: Payer: Medicaid Other

## 2023-04-06 ENCOUNTER — Emergency Department
Admission: EM | Admit: 2023-04-06 | Discharge: 2023-04-06 | Disposition: A | Discharge status: Home / Self Care | Payer: Medicaid Other | Attending: Student in an Organized Health Care Education/Training Program | Admitting: Student in an Organized Health Care Education/Training Program

## 2023-04-06 ENCOUNTER — Other Ambulatory Visit: Payer: Self-pay

## 2023-04-06 DIAGNOSIS — R519 Headache, unspecified: Secondary | ICD-10-CM | POA: Diagnosis not present

## 2023-04-06 DIAGNOSIS — N951 Menopausal and female climacteric states: Secondary | ICD-10-CM | POA: Diagnosis present

## 2023-04-06 DIAGNOSIS — Z1152 Encounter for screening for COVID-19: Secondary | ICD-10-CM | POA: Diagnosis not present

## 2023-04-06 DIAGNOSIS — R0602 Shortness of breath: Secondary | ICD-10-CM | POA: Insufficient documentation

## 2023-04-06 DIAGNOSIS — R232 Flushing: Secondary | ICD-10-CM

## 2023-04-06 LAB — CBC
HCT: 35.9 % — ABNORMAL LOW (ref 36.0–46.0)
Hemoglobin: 12 g/dL (ref 12.0–15.0)
MCH: 26 pg (ref 26.0–34.0)
MCHC: 33.4 g/dL (ref 30.0–36.0)
MCV: 77.7 fL — ABNORMAL LOW (ref 80.0–100.0)
Platelets: 253 10*3/uL (ref 150–400)
RBC: 4.62 MIL/uL (ref 3.87–5.11)
RDW: 15.1 % (ref 11.5–15.5)
WBC: 6.3 10*3/uL (ref 4.0–10.5)
nRBC: 0 % (ref 0.0–0.2)

## 2023-04-06 LAB — BASIC METABOLIC PANEL
Anion gap: 5 (ref 5–15)
BUN: 11 mg/dL (ref 6–20)
CO2: 23 mmol/L (ref 22–32)
Calcium: 8.1 mg/dL — ABNORMAL LOW (ref 8.9–10.3)
Chloride: 110 mmol/L (ref 98–111)
Creatinine, Ser: 0.69 mg/dL (ref 0.44–1.00)
GFR, Estimated: >60 mL/min (ref >60)
Glucose, Bld: 101 mg/dL — ABNORMAL HIGH (ref 70–99)
Potassium: 3.8 mmol/L (ref 3.5–5.1)
Sodium: 138 mmol/L (ref 135–145)

## 2023-04-06 LAB — POC URINE PREG, ED: Preg Test, Ur: NEGATIVE

## 2023-04-06 LAB — TROPONIN I (HIGH SENSITIVITY): Troponin I (High Sensitivity): 3 ng/L (ref <18)

## 2023-04-06 LAB — SARS CORONAVIRUS 2 BY RT PCR: SARS Coronavirus 2 by RT PCR: NEGATIVE

## 2023-04-06 NOTE — ED Provider Notes (Signed)
[facility] Provider Note    Event Date/Time   First MD Initiated Contact with Patient 04/06/23 1519     (approximate)   History   Shortness of Breath   HPI  Vanessa Conner is a 32 y.o. female significant past medical history presents to the ER for evaluation of symptoms of hot flashes headache some shortness of breath of the past 2 days.  Denies any nausea vomiting diarrhea.  Symptoms last about 2 to 5 minutes.  No palpitations no chest pain.  No lower extremity swelling.  No history of DVT or PE or heart trouble.  Has not felt any wheezing.  Not on any birth control.     Physical Exam   Triage Vital Signs: ED Triage Vitals  Encounter Vitals Group     BP 04/06/23 1505 123/73     Systolic BP Percentile --      Diastolic BP Percentile --      Pulse Rate 04/06/23 1505 80     Resp 04/06/23 1503 20     Temp 04/06/23 1503 98.6 F (37 C)     Temp src --      SpO2 04/06/23 1505 95 %     Weight 04/06/23 1503 300 lb (136.1 kg)     Height 04/06/23 1503 5\' 7"  (1.702 m)     Head Circumference --      Peak Flow --      Pain Score 04/06/23 1503 0     Pain Loc --      Pain Education --      Exclude from Growth Chart --     Most recent vital signs: Vitals:   04/06/23 1505 04/06/23 1615  BP: 123/73   Pulse: 80 77  Resp:  17  Temp:    SpO2: 95% 100%     Constitutional: Alert  Eyes: Conjunctivae are normal.  Head: Atraumatic. Nose: No congestion/rhinnorhea. Mouth/Throat: Mucous membranes are moist.   Neck: Painless ROM.  Cardiovascular:   Good peripheral circulation. Respiratory: Normal respiratory effort.  No retractions.  Gastrointestinal: Soft and nontender.  Musculoskeletal:  no deformity Neurologic:  MAE spontaneously. No gross focal neurologic deficits are appreciated.  Skin:  Skin is warm, dry and intact. No rash noted. Psychiatric: Mood and affect are normal. Speech and behavior are normal.    ED Results / Procedures / Treatments    Labs (all labs ordered are listed, but only abnormal results are displayed) Labs Reviewed  CBC - Abnormal; Notable for the following components:      Result Value   HCT 35.9 (*)    MCV 77.7 (*)    All other components within normal limits  BASIC METABOLIC PANEL - Abnormal; Notable for the following components:   Glucose, Bld 101 (*)    Calcium 8.1 (*)    All other components within normal limits  SARS CORONAVIRUS 2 BY RT PCR  POC URINE PREG, ED  TROPONIN I (HIGH SENSITIVITY)     EKG  ED ECG REPORT I, Willy Eddy, the attending physician, personally viewed and interpreted this ECG.   Date: 04/06/2023  EKG Time: 15:08  Rate: 80  Rhythm: sinus  Axis: normal  Intervals: normal  ST&T Change: no stemi, no depressions    RADIOLOGY Please see ED Course for my review and interpretation.  I personally reviewed all radiographic images ordered to evaluate for the above acute complaints and reviewed radiology reports and findings.  These findings were personally discussed with  the patient.  Please see medical record for radiology report.    PROCEDURES:  Critical Care performed: No  Procedures   MEDICATIONS ORDERED IN ED: Medications - No data to display   IMPRESSION / MDM / ASSESSMENT AND PLAN / ED COURSE  I reviewed the triage vital signs and the nursing notes.                              Differential diagnosis includes, but is not limited to, Asthma, copd, CHF, pna, ptx, malignancy, Pe, anemia  Patient presenting to the ER for evaluation of symptoms as described above.  Based on symptoms, risk factors and considered above differential, this presenting complaint could reflect a potentially life-threatening illness therefore the patient will be placed on continuous pulse oximetry and telemetry for monitoring.  Laboratory evaluation will be sent to evaluate for the above complaints.  Patient with clear lung sounds.  No significant lower extremity swelling or  edema.  Low risk by Wells criteria PERC negative.  Doubt ACS.  Possible viral illness.  Chest x-ray on my review and encouraged her potation does not show any evidence of infiltrate or consolidation.   Clinical Course as of 04/06/23 1819  Wed Apr 06, 2023  1818 Patient reassessed.  Remains well-appearing in no acute distress.  Reassuring exam laboratory evaluation and imaging.  She is asymptomatic at this time therefore I do not feel that further diagnostic testing clinically indicated.  She does appear stable and appropriate for outpatient follow-up and agrees to return if her symptoms recur.  Have encouraged her to increase oral hydration over the next few days as it may simply be a little bit dehydrated but do not feel that she requires IV hydration at this time.  Patient agreeable to plan. [PR]    Clinical Course User Index [PR] Willy Eddy, MD     FINAL CLINICAL IMPRESSION(S) / ED DIAGNOSES   Final diagnoses:  Hot flashes     Rx / DC Orders   ED Discharge Orders     None        Note:  This document was prepared using Dragon voice recognition software and may include unintentional dictation errors.    Willy Eddy, MD 04/06/23 [phone]

## 2023-04-06 NOTE — Discharge Instructions (Addendum)
Be sure to drink plenty of fluids over the next few days.  If you have any worsening symptoms questions or concerns please follow-up with PCP or return to the ER for reevaluation.

## 2023-04-06 NOTE — ED Notes (Signed)
Called lab to run troponin off previous specimen.

## 2023-04-06 NOTE — ED Triage Notes (Signed)
Pt to ED for shob and hot flashes when standing for 2 days. Ambulatory to triage, RR even and unlabored. Denies pain.

## 2023-05-10 ENCOUNTER — Ambulatory Visit (LOCAL_COMMUNITY_HEALTH_CENTER): Payer: Self-pay

## 2023-05-10 DIAGNOSIS — Z111 Encounter for screening for respiratory tuberculosis: Secondary | ICD-10-CM

## 2023-05-13 ENCOUNTER — Ambulatory Visit (LOCAL_COMMUNITY_HEALTH_CENTER): Payer: Medicaid Other

## 2023-05-13 DIAGNOSIS — Z111 Encounter for screening for respiratory tuberculosis: Secondary | ICD-10-CM

## 2023-05-13 LAB — TB SKIN TEST
Induration: 0 mm
TB Skin Test: NEGATIVE

## 2023-06-07 NOTE — Progress Notes (Signed)
[facility] [address] [place], [place] [address]  Pulmonary Sleep Medicine   Office Visit Note  Patient Name: Vanessa Conner DOB: 04-12-1991 MRN [id number]    Chief Complaint: Obstructive Sleep Apnea visit  Brief History:  Sherryn is seen today for an annual follow up visit for APAP@ 4-20 cmH2O. The patient has a 2 year history of sleep apnea. Patient is using PAP nightly.  The patient feels rested after sleeping with PAP.  The patient reports benefiting from PAP use. Reported sleepiness is  improved and the Epworth Sleepiness Score is 9 out of 24. The patient does take naps. The patient complains of the following: Headaches and gasping. Patient is in need of new supplies. The compliance download shows 77% compliance with an average use time of 6 hours 44 minutes. The AHI is 0.2.  The patient does not complain of limb movements disrupting sleep. The patient continues to require PAP therapy in order to eliminate sleep apnea.   ROS  General: (-) fever, (-) chills, (-) night sweat Nose and Sinuses: (-) nasal stuffiness or itchiness, (-) postnasal drip, (-) nosebleeds, (-) sinus trouble. Mouth and Throat: (-) sore throat, (-) hoarseness. Neck: (-) swollen glands, (-) enlarged thyroid, (-) neck pain. Respiratory: - cough, - shortness of breath, - wheezing. Neurologic: - numbness, - tingling. Psychiatric: + anxiety, - depression   Current Medication: Outpatient Encounter Medications as of 06/08/2023  Medication Sig   famotidine (PEPCID) 20 MG tablet Take by mouth.   fluconazole (DIFLUCAN) 150 MG tablet Take 1 tablet (150 mg total) by mouth daily. Repeat in 1 week if needed.   losartan-hydrochlorothiazide (HYZAAR) 100-25 MG tablet Take 1 tablet by mouth daily.   magic mouthwash SOLN 12mL Anbesol 30mL Benadryl 30mL Mylanta  5mL swish, gargle & spit q8hr prn throat discomfort   omeprazole (PRILOSEC) 20 MG capsule Take 20 mg by mouth daily.   triamcinolone cream  (KENALOG) 0.1 % Apply 1 application topically 4 (four) times daily.   Vitamin D, Ergocalciferol, (DRISDOL) 1.25 MG (50000 UNIT) CAPS capsule Take 50,000 Units by mouth once a week.   No facility-administered encounter medications on file as of 06/08/2023.    Surgical History: History reviewed. No pertinent surgical history.  Medical History: Past Medical History:  Diagnosis Date   Hypertension     Family History: Non contributory to the present illness  Social History: Social History   Socioeconomic History   Marital status: Single    Spouse name: Not on file   Number of children: Not on file   Years of education: Not on file   Highest education level: Not on file  Occupational History   Not on file  Tobacco Use   Smoking status: Never   Smokeless tobacco: Never  Vaping Use   Vaping status: Never Used  Substance and Sexual Activity   Alcohol use: Yes   Drug use: No   Sexual activity: Not on file  Other Topics Concern   Not on file  Social History Narrative   Not on file   Social Determinants of Health   Financial Resource Strain: Not on file  Food Insecurity: Not on file  Transportation Needs: Not on file  Physical Activity: Not on file  Stress: Not on file  Social Connections: Unknown (01/18/2022)   Received from [facility], [facility]   Social Network    Social Network: Not on file  Intimate Partner Violence: Unknown (12/10/2021)   Received from [facility], [facility]  HITS    Physically Hurt: Not on file    Insult or Talk Down To: Not on file    Threaten Physical Harm: Not on file    Scream or Curse: Not on file    Vital Signs: Blood pressure 125/86, pulse 77, resp. rate 16, height 5\' 7"  (1.702 m), weight (!) 310 lb (140.6 kg), last menstrual period 05/01/2023, SpO2 98%. Body mass index is 48.55 kg/m.    Examination: General Appearance: The patient is well-developed, well-nourished, and in no distress. Neck Circumference: 41 cm Skin:  Gross inspection of skin unremarkable. Head: normocephalic, no gross deformities. Eyes: no gross deformities noted. ENT: ears appear grossly normal Neurologic: Alert and oriented. No involuntary movements.  STOP BANG RISK ASSESSMENT S (snore) Have you been told that you snore?     NO   T (tired) Are you often tired, fatigued, or sleepy during the day?   YES  O (obstruction) Do you stop breathing, choke, or gasp during sleep? YES   P (pressure) Do you have or are you being treated for high blood pressure? YES   B (BMI) Is your body index greater than 35 kg/m? YES   A (age) Are you 81 years old or older? NO   N (neck) Do you have a neck circumference greater than 16 inches?   NO   G (gender) Are you a female? NO   TOTAL STOP/BANG "YES" ANSWERS 4       A STOP-Bang score of 2 or less is considered low risk, and a score of 5 or more is high risk for having either moderate or severe OSA. For people who score 3 or 4, doctors may need to perform further assessment to determine how likely they are to have OSA.         EPWORTH SLEEPINESS SCALE:  Scale:  (0)= no chance of dozing; (1)= slight chance of dozing; (2)= moderate chance of dozing; (3)= high chance of dozing  Chance  Situtation    Sitting and reading: 3    Watching TV: 2    Sitting Inactive in public: 0    As a passenger in car: 2      Lying down to rest: 2    Sitting and talking: 0    Sitting quielty after lunch: 0    In a car, stopped in traffic: 0   TOTAL SCORE:   9 out of 24    SLEEP STUDIES:  PSG (01/2021) AHI 20/hr, min SpO2 85% Titration (02/2021) APAP@ 9-15 cmH2O   CPAP COMPLIANCE DATA:  Date Range: 04/10/2022-04/09/2023  Average Daily Use: 6 hours 44 minutes  Median Use: 6 hours 47 minutes  Compliance for > 4 Hours: 77%  AHI: 0.2 respiratory events per hour  Days Used: 294/365 days  Mask Leak: 13.3  95th Percentile Pressure: 7.7         LABS: Recent Results (from the past  2160 hour(s))  CBC     Status: Abnormal   Collection Time: 04/06/23  3:06 PM  Result Value Ref Range   WBC 6.3 4.0 - 10.5 K/uL   RBC 4.62 3.87 - 5.11 MIL/uL   Hemoglobin 12.0 12.0 - 15.0 g/dL   HCT 16.1 (L) 09.6 - 04.5 %   MCV 77.7 (L) 80.0 - 100.0 fL   MCH 26.0 26.0 - 34.0 pg   MCHC 33.4 30.0 - 36.0 g/dL   RDW 40.9 81.1 - 91.4 %   Platelets 253 150 - 400 K/uL  nRBC 0.0 0.0 - 0.2 %    Comment: Performed at [facility], [address] [place], [place] [address]  Basic metabolic panel     Status: Abnormal   Collection Time: 04/06/23  3:06 PM  Result Value Ref Range   Sodium 138 135 - 145 mmol/L   Potassium 3.8 3.5 - 5.1 mmol/L   Chloride 110 98 - 111 mmol/L   CO2 23 22 - 32 mmol/L   Glucose, Bld 101 (H) 70 - 99 mg/dL    Comment: Glucose reference range applies only to samples taken after fasting for at least 8 hours.   BUN 11 6 - 20 mg/dL   Creatinine, Ser 9.14 0.44 - 1.00 mg/dL   Calcium 8.1 (L) 8.9 - 10.3 mg/dL   GFR, Estimated >78 >29 mL/min    Comment: (NOTE) Calculated using the CKD-EPI Creatinine Equation (2021)    Anion gap 5 5 - 15    Comment: Performed at [facility], [address] [place], [place] [address]  Troponin I (High Sensitivity)     Status: None   Collection Time: 04/06/23  3:06 PM  Result Value Ref Range   Troponin I (High Sensitivity) 3 <18 ng/L    Comment: (NOTE) Elevated high sensitivity troponin I (hsTnI) values and significant  changes across serial measurements may suggest ACS but many other  chronic and acute conditions are known to elevate hsTnI results.  Refer to the "Links" section for chest pain algorithms and additional  guidance. Performed at [facility], [address] [place], [place] [address]   SARS Coronavirus 2 by RT PCR (hospital order, performed in [facility] hospital lab) *cepheid single result test* Anterior Nasal Swab     Status: None   Collection Time: 04/06/23  3:41 PM   Specimen:  Anterior Nasal Swab  Result Value Ref Range   SARS Coronavirus 2 by RT PCR NEGATIVE NEGATIVE    Comment: (NOTE) SARS-CoV-2 target nucleic acids are NOT DETECTED.  The SARS-CoV-2 RNA is generally detectable in upper and lower respiratory specimens during the acute phase of infection. The lowest concentration of SARS-CoV-2 viral copies this assay can detect is 250 copies / mL. A negative result does not preclude SARS-CoV-2 infection and should not be used as the sole basis for treatment or other patient management decisions.  A negative result may occur with improper specimen collection / handling, submission of specimen other than nasopharyngeal swab, presence of viral mutation(s) within the areas targeted by this assay, and inadequate number of viral copies (<250 copies / mL). A negative result must be combined with clinical observations, patient history, and epidemiological information.  Fact Sheet for Patients:   RoadLapTop.co.za  Fact Sheet for Healthcare Providers: http://kim-miller.com/  This test is not yet approved or  cleared by the [place] FDA and has been authorized for detection and/or diagnosis of SARS-CoV-2 by FDA under an Emergency Use Authorization (EUA).  This EUA will remain in effect (meaning this test can be used) for the duration of the COVID-19 declaration under Section 564(b)(1) of the Act, 21 U.S.C. section 360bbb-3(b)(1), unless the authorization is terminated or revoked sooner.  Performed at [facility], [address] [place], [place] [address]   POC Urine Pregnancy, ED     Status: None   Collection Time: 04/06/23  6:13 PM  Result Value Ref Range   Preg Test, Ur NEGATIVE NEGATIVE    Comment:        THE SENSITIVITY OF  THIS METHODOLOGY IS >24 mIU/mL   TB Skin Test     Status: Normal   Collection Time: 05/13/23  8:16 AM  Result Value Ref Range   TB Skin Test Negative    Induration 0 mm     Radiology: DG Chest 2 View  Result Date: 04/06/2023 CLINICAL DATA:  shob EXAM: CHEST - 2 VIEW COMPARISON:  03/30/2021 FINDINGS: Prominent upper lobe perihilar vessels suggesting some cephalization of blood flow. No overt edema or focal infiltrate. Lungs are clear. Heart size and mediastinal contours are within normal limits. No effusion. Visualized bones unremarkable. IMPRESSION: No acute cardiopulmonary disease. Electronically Signed   By: Corlis Leak M.D.   On: 04/06/2023 16:05    No results found.  No results found.    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 The patient was reminded how to adjust mask fit and advised to change supplies regularly. The patient was also counselled on nightly use. The compliance is fair. The AHI is 0.2. Patient continues to require PAP to treat their apnea and is medically necessary.   1. OSA (obstructive sleep apnea) Continue nightly use  2. CPAP use counseling CPAP couseling-Discussed importance of adequate CPAP use as well as proper care and cleaning techniques of machine and all supplies.  3. Essential hypertension Continue current medication and f/u with PCP.  4. Morbid obesity with BMI of 45.0-49.9, adult (HCC) Obesity Counseling: Had a lengthy discussion regarding patients BMI and weight issues. Patient was instructed on portion control as well as increased activity. Also discussed caloric restrictions with trying to maintain intake less than 2000 Kcal. Discussions were made in accordance with the 5As of weight management. Simple actions such as not eating late and if able to, taking a walk is suggested.    General Counseling: I have discussed the findings of the evaluation and examination with Kiernan.  I have also discussed any further diagnostic  evaluation thatmay be needed or ordered today. Dallys verbalizes understanding of the findings of todays visit. We also reviewed her medications today and discussed drug interactions and side effects including but not limited excessive drowsiness and altered mental states. We also discussed that there is always a risk not just to her but also people around her. she has been encouraged to call the office with any questions or concerns that should arise related to todays visit.  No orders of the defined types were placed in this encounter.       I have personally obtained a history, examined the patient, evaluated laboratory and imaging results, formulated the assessment and plan and placed orders.  This patient was seen by Lynn Ito, PA-C in collaboration with Dr. Freda Munro as a part of collaborative care agreement.  Yevonne Pax, MD [facility] Diplomate ABMS Pulmonary Critical Care Medicine and Sleep Medicine

## 2023-06-08 ENCOUNTER — Ambulatory Visit (INDEPENDENT_AMBULATORY_CARE_PROVIDER_SITE_OTHER): Payer: Medicaid Other | Admitting: Internal Medicine

## 2023-06-08 VITALS — BP 125/86 | HR 77 | Resp 16 | Ht 67.0 in | Wt 310.0 lb

## 2023-06-08 DIAGNOSIS — Z7189 Other specified counseling: Secondary | ICD-10-CM | POA: Diagnosis not present

## 2023-06-08 DIAGNOSIS — G4733 Obstructive sleep apnea (adult) (pediatric): Secondary | ICD-10-CM

## 2023-06-08 DIAGNOSIS — Z6841 Body Mass Index (BMI) 40.0 and over, adult: Secondary | ICD-10-CM

## 2023-06-08 DIAGNOSIS — I10 Essential (primary) hypertension: Secondary | ICD-10-CM | POA: Diagnosis not present

## 2023-06-08 NOTE — Patient Instructions (Signed)

## 2023-06-20 ENCOUNTER — Emergency Department
Admission: EM | Admit: 2023-06-20 | Discharge: 2023-06-20 | Disposition: A | Discharge status: Home / Self Care | Payer: Medicaid Other | Attending: Emergency Medicine | Admitting: Emergency Medicine

## 2023-06-20 ENCOUNTER — Other Ambulatory Visit: Payer: Self-pay

## 2023-06-20 ENCOUNTER — Encounter: Payer: Self-pay | Admitting: *Deleted

## 2023-06-20 DIAGNOSIS — I10 Essential (primary) hypertension: Secondary | ICD-10-CM | POA: Diagnosis not present

## 2023-06-20 DIAGNOSIS — R1030 Lower abdominal pain, unspecified: Secondary | ICD-10-CM | POA: Insufficient documentation

## 2023-06-20 LAB — URINALYSIS, ROUTINE W REFLEX MICROSCOPIC
Bilirubin Urine: NEGATIVE
Glucose, UA: NEGATIVE mg/dL
Hgb urine dipstick: NEGATIVE
Ketones, ur: NEGATIVE mg/dL
Leukocytes,Ua: NEGATIVE
Nitrite: NEGATIVE
Protein, ur: NEGATIVE mg/dL
Specific Gravity, Urine: 1.012 (ref 1.005–1.030)
pH: 6 (ref 5.0–8.0)

## 2023-06-20 LAB — COMPREHENSIVE METABOLIC PANEL
ALT: 12 U/L (ref 0–44)
AST: 16 U/L (ref 15–41)
Albumin: 3.5 g/dL (ref 3.5–5.0)
Alkaline Phosphatase: 90 U/L (ref 38–126)
Anion gap: 9 (ref 5–15)
BUN: 8 mg/dL (ref 6–20)
CO2: 23 mmol/L (ref 22–32)
Calcium: 8.5 mg/dL — ABNORMAL LOW (ref 8.9–10.3)
Chloride: 106 mmol/L (ref 98–111)
Creatinine, Ser: 0.75 mg/dL (ref 0.44–1.00)
GFR, Estimated: >60 mL/min (ref >60)
Glucose, Bld: 93 mg/dL (ref 70–99)
Potassium: 3.6 mmol/L (ref 3.5–5.1)
Sodium: 138 mmol/L (ref 135–145)
Total Bilirubin: 0.4 mg/dL (ref 0.3–1.2)
Total Protein: 7 g/dL (ref 6.5–8.1)

## 2023-06-20 LAB — CBC
HCT: 35.9 % — ABNORMAL LOW (ref 36.0–46.0)
Hemoglobin: 11.7 g/dL — ABNORMAL LOW (ref 12.0–15.0)
MCH: 25.7 pg — ABNORMAL LOW (ref 26.0–34.0)
MCHC: 32.6 g/dL (ref 30.0–36.0)
MCV: 78.9 fL — ABNORMAL LOW (ref 80.0–100.0)
Platelets: 264 10*3/uL (ref 150–400)
RBC: 4.55 MIL/uL (ref 3.87–5.11)
RDW: 15.1 % (ref 11.5–15.5)
WBC: 6.4 10*3/uL (ref 4.0–10.5)
nRBC: 0 % (ref 0.0–0.2)

## 2023-06-20 LAB — WET PREP, GENITAL
Clue Cells Wet Prep HPF POC: NONE SEEN
Sperm: NONE SEEN
Trich, Wet Prep: NONE SEEN
WBC, Wet Prep HPF POC: <10
Yeast Wet Prep HPF POC: NONE SEEN

## 2023-06-20 LAB — CHLAMYDIA/NGC RT PCR (ARMC ONLY)
Chlamydia Tr: NOT DETECTED
N gonorrhoeae: NOT DETECTED

## 2023-06-20 LAB — LIPASE, BLOOD: Lipase: 24 U/L (ref 11–51)

## 2023-06-20 LAB — POC URINE PREG, ED: Preg Test, Ur: NEGATIVE

## 2023-06-20 MED ORDER — POLYETHYLENE GLYCOL 3350 17 G PO PACK: 17.0000 g | PACK | Freq: Every day | ORAL | 0 refills | Status: DC

## 2023-06-20 MED ORDER — SENNOSIDES-DOCUSATE SODIUM 8.6-50 MG PO TABS: 1.0000 | ORAL_TABLET | Freq: Every day | ORAL | 0 refills | Status: DC

## 2023-06-20 NOTE — Discharge Instructions (Addendum)
You were evaluated in the ED for abdominal pain.  Your lab work is reassuring.  Your urine is reassuring.  Your wet prep and STD screening is negative.  Please follow-up with primary care if symptoms do not improve in 1 week.

## 2023-06-20 NOTE — ED Notes (Signed)
SST tubes sent to lab in addition to ordered labs

## 2023-06-20 NOTE — ED Triage Notes (Signed)
Pt is here for evaluation of lower abdominal pain x1 week. No vaginal or urinary symptoms.  Pt states that her significant other "stepped out" and she would like STI testing.

## 2023-06-20 NOTE — ED Provider Notes (Signed)
[facility] Emergency Department Provider Note     Event Date/Time   First MD Initiated Contact with Patient 06/20/23 1501     (approximate)   History   Abdominal Pain   HPI  ENNA WARWICK is a 32 y.o. female with a history of hypertension presents to the ED with complaint of lower abdominal pain x 1 week.  Patient reports her significant other cheated on her and would like STD screening.  She also reports having some constipation.  Pain is localized to lower abdomen on both sides.  No radiation.  She denies dysuria, hematuria, rectal bleeding and nausea vomiting.  Denies itching and vaginal discharge.     Physical Exam   Triage Vital Signs: ED Triage Vitals  Encounter Vitals Group     BP 06/20/23 1330 136/86     Systolic BP Percentile --      Diastolic BP Percentile --      Pulse Rate 06/20/23 1330 79     Resp 06/20/23 1330 14     Temp 06/20/23 1330 98.3 F (36.8 C)     Temp Source 06/20/23 1330 Oral     SpO2 06/20/23 1330 100 %     Weight --      Height --      Head Circumference --      Peak Flow --      Pain Score 06/20/23 1331 5     Pain Loc --      Pain Education --      Exclude from Growth Chart --     Most recent vital signs: Vitals:   06/20/23 1330  BP: 136/86  Pulse: 79  Resp: 14  Temp: 98.3 F (36.8 C)  SpO2: 100%    General: Well appearing. Alert and oriented. INAD.  Skin:  Warm, dry and intact. No rashes or lesions noted.     Head:  NCAT.  Eyes:  PERRLA. EOMI.  CV:  Good peripheral perfusion.   RESP:  Normal effort.  ABD:  No distention. Soft, Non tender. No masses or organomegaly. No CVA tenderness bilaterally.    ED Results / Procedures / Treatments   Labs (all labs ordered are listed, but only abnormal results are displayed) Labs Reviewed  COMPREHENSIVE METABOLIC PANEL - Abnormal; Notable for the following components:      Result Value   Calcium 8.5 (*)    All other components within normal limits   CBC - Abnormal; Notable for the following components:   Hemoglobin 11.7 (*)    HCT 35.9 (*)    MCV 78.9 (*)    MCH 25.7 (*)    All other components within normal limits  URINALYSIS, ROUTINE W REFLEX MICROSCOPIC - Abnormal; Notable for the following components:   Color, Urine YELLOW (*)    APPearance CLOUDY (*)    All other components within normal limits  CHLAMYDIA/NGC RT PCR ([facility] ONLY)            WET PREP, GENITAL  LIPASE, BLOOD  POC URINE PREG, ED   No results found.  PROCEDURES:  Critical Care performed: No  Procedures   MEDICATIONS ORDERED IN ED: Medications - No data to display   IMPRESSION / MDM / ASSESSMENT AND PLAN / ED COURSE  I reviewed the triage vital signs and the nursing notes.  32 y.o. female presents to the emergency department for evaluation and treatment of lower abdominal pain. See HPI for further details.   Differential diagnosis includes, but is not limited to UTI, constipation, chlamydia, gonorrhea  Patient's presentation is most consistent with acute complicated illness / injury requiring diagnostic workup.  Patient is alert and oriented.  She is well-appearing and hemodynamically stable.  Labs are reassuring.  Urinalysis is reassuring.  Wet prep and chlamydia are negative.  Physical exam is overall benign.  Patient is not in any pain at this time.  Presentation is clinically consistent with constipation and her unknown LMP.  Patient will be prescribed MiraLAX and docusate to help her move stool.  ED precautions discussed.  she is advised to follow-up with her primary care if symptoms do not improve in 1 week.  FINAL CLINICAL IMPRESSION(S) / ED DIAGNOSES   Final diagnoses:  Lower abdominal pain   Rx / DC Orders   ED Discharge Orders          Ordered    polyethylene glycol (MIRALAX) 17 g packet  Daily,   Status:  Discontinued        06/20/23 1543    senna-docusate (SENOKOT-S) 8.6-50 MG tablet  Daily,   Status:   Discontinued        06/20/23 1543    polyethylene glycol (MIRALAX) 17 g packet  Daily        06/20/23 1544    senna-docusate (SENOKOT-S) 8.6-50 MG tablet  Daily        06/20/23 1544             Note:  This document was prepared using Dragon voice recognition software and may include unintentional dictation errors.    Romeo Apple, Marvell Stavola A, PA-C 06/20/23 1933    Chesley Noon, MD 06/20/23 2329

## 2023-07-27 ENCOUNTER — Other Ambulatory Visit: Payer: Self-pay | Admitting: Physician Assistant

## 2023-07-27 ENCOUNTER — Telehealth: Payer: Self-pay

## 2023-07-27 DIAGNOSIS — Z331 Pregnant state, incidental: Secondary | ICD-10-CM

## 2023-08-02 ENCOUNTER — Other Ambulatory Visit: Payer: Self-pay

## 2023-08-06 ENCOUNTER — Emergency Department
Admission: EM | Admit: 2023-08-06 | Discharge: 2023-08-06 | Disposition: A | Discharge status: Home / Self Care | Payer: Medicaid Other | Attending: Emergency Medicine | Admitting: Emergency Medicine

## 2023-08-06 ENCOUNTER — Other Ambulatory Visit: Payer: Self-pay

## 2023-08-06 DIAGNOSIS — J069 Acute upper respiratory infection, unspecified: Secondary | ICD-10-CM | POA: Diagnosis not present

## 2023-08-06 DIAGNOSIS — O99891 Other specified diseases and conditions complicating pregnancy: Secondary | ICD-10-CM | POA: Diagnosis present

## 2023-08-06 DIAGNOSIS — O99512 Diseases of the respiratory system complicating pregnancy, second trimester: Secondary | ICD-10-CM | POA: Insufficient documentation

## 2023-08-06 DIAGNOSIS — Z1152 Encounter for screening for COVID-19: Secondary | ICD-10-CM | POA: Diagnosis not present

## 2023-08-06 LAB — RESP PANEL BY RT-PCR (RSV, FLU A&B, COVID)  RVPGX2
Influenza A by PCR: NEGATIVE
Influenza B by PCR: NEGATIVE
Resp Syncytial Virus by PCR: NEGATIVE
SARS Coronavirus 2 by RT PCR: NEGATIVE

## 2023-08-06 NOTE — Discharge Instructions (Signed)
Your COVID, flu, RSV swab was negative.  Please follow-up with your outpatient provider.  Please return for any new, worsening, or change in symptoms or other concerns.  It was a pleasure caring for you today.

## 2023-08-06 NOTE — ED Provider Notes (Signed)
[facility] Provider Note    Event Date/Time   First MD Initiated Contact with Patient 08/06/23 [phone]     (approximate)   History   Influenza   HPI  Vanessa Conner is a 32 y.o. female currently reports that she is estimated to be [redacted] weeks pregnant who presents today for evaluation of cough, stuffy nose, runny nose, and sneezing for the past 4 days.  She is unsure of any sick contacts.  She reports that she took Tylenol earlier this morning.  She did not get a flu shot this year.  No chest pain or shortness of breath.  No abdominal pain, vaginal discharge, or vaginal bleeding.  No dysuria or back pain.  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          Physical Exam   Triage Vital Signs: ED Triage Vitals  Encounter Vitals Group     BP 08/06/23 0802 (!) 140/83     Systolic BP Percentile --      Diastolic BP Percentile --      Pulse Rate 08/06/23 0802 (!) 56     Resp 08/06/23 0802 19     Temp 08/06/23 0802 98.4 F (36.9 C)     Temp src --      SpO2 08/06/23 0802 100 %     Weight 08/06/23 0800 298 lb (135.2 kg)     Height 08/06/23 0800 5\' 7"  (1.702 m)     Head Circumference --      Peak Flow --      Pain Score 08/06/23 0800 0     Pain Loc --      Pain Education --      Exclude from Growth Chart --     Most recent vital signs: Vitals:   08/06/23 0802 08/06/23 0907  BP: (!) 140/83 138/80  Pulse: (!) 56 75  Resp: 19 18  Temp: 98.4 F (36.9 C)   SpO2: 100% 100%    Physical Exam Vitals and nursing note reviewed.  Constitutional:      General: Awake and alert. No acute distress.    Appearance: Normal appearance. The patient is obese.  HENT:     Head: Normocephalic and atraumatic.     Mouth: Mucous membranes are moist.  Eyes:     General: PERRL. Normal EOMs        Right eye: No discharge.        Left eye: No discharge.      Conjunctiva/sclera: Conjunctivae normal.  Cardiovascular:     Rate and Rhythm: Normal rate and regular rhythm.     Pulses: Normal pulses.  Pulmonary:     Effort: Pulmonary effort is normal. No respiratory distress.     Breath sounds: Normal breath sounds.  Abdominal:     Abdomen is soft. There is no abdominal tenderness. No rebound or guarding. No distention. Musculoskeletal:        General: No swelling. Normal range of motion.     Cervical back: Normal range of motion and neck supple.  Skin:    General: Skin is warm and dry.     Capillary Refill: Capillary refill takes less than 2 seconds.     Findings: No rash.  Neurological:     Mental Status: The patient is awake and alert.      ED Results / Procedures / Treatments   Labs (all  labs ordered are listed, but only abnormal results are displayed) Labs Reviewed  RESP PANEL BY RT-PCR (RSV, FLU A&B, COVID)  [id number]     EKG     RADIOLOGY     PROCEDURES:  Critical Care performed:   Procedures   MEDICATIONS ORDERED IN ED: Medications - No data to display   IMPRESSION / MDM / ASSESSMENT AND PLAN / ED COURSE  I reviewed the triage vital signs and the nursing notes.   Differential diagnosis includes, but is not limited to, influenza, COVID-19, RSV, bronchitis, other viral URI.  Patient is awake and alert, hemodynamically stable and afebrile.  She is nontoxic in appearance.  She has normal oxygen saturation of 100% on room air and demonstrates no increased work of breathing.  Her lungs are clear to auscultation bilaterally, no fever, do not suspect pneumonia.  Will save her and her developing fetus to radiation which patient is in agreement with.  Swab obtained in triage and is negative.  Symptoms most consistent with viral URI.  Patient declined symptomatic management in the emergency department.  We discussed symptomatic management at home, which are pregnancy safe such as Tylenol and warm tea and plenty of rest.  We  discussed strict return precautions and importance of close outpatient follow-up.  Patient understands and agrees with plan.  She was discharged in stable condition.   Patient's presentation is most consistent with acute complicated illness / injury requiring diagnostic workup.    FINAL CLINICAL IMPRESSION(S) / ED DIAGNOSES   Final diagnoses:  Upper respiratory tract infection, unspecified type     Rx / DC Orders   ED Discharge Orders     None        Note:  This document was prepared using Dragon voice recognition software and may include unintentional dictation errors.   Keturah Shavers 08/06/23 1610    Pilar Jarvis, MD 08/06/23 1034

## 2023-08-06 NOTE — ED Triage Notes (Addendum)
Pt comes with c/o headache, coughing runny nose, congestion since Monday. Pt states not covid flu test yet. Pt states hot and cold chills, unsure if any fevers.   Pt also states she is 6 weeks 4 days pregnant and wants that checked to. Pt states she did have little spotting. Pt has not had 1st ob visit yet.

## 2023-08-15 ENCOUNTER — Telehealth: Payer: Self-pay

## 2023-08-15 NOTE — Telephone Encounter (Addendum)
Pt has NOB appt tomorrow at 8:15. Eupora Sink is triage tomorrow and Crystal S has asked me to contact pt to see if I can do NOB intake today before lunch since I have ABC this afternoon. Called pt, no answer, LVMTRC.

## 2023-08-16 ENCOUNTER — Telehealth: Payer: Self-pay | Admitting: Obstetrics

## 2023-08-16 ENCOUNTER — Telehealth: Payer: Medicaid Other

## 2023-08-16 NOTE — Telephone Encounter (Signed)
Reached out to pt to reschedule MyChart Video Visit (NOB intake appt) that was scheduled on 08/16/2023 at 1:15.  Left message for pt to call back to reschedule.

## 2023-08-17 ENCOUNTER — Encounter: Payer: Self-pay | Admitting: Obstetrics

## 2023-08-17 NOTE — Telephone Encounter (Signed)
Reached out to pt (2x) to reschedule MyChart video visit (NOB intake appt) that was scheduled on 08/16/2023 at 1:15.  Left message for pt to call back.  Will send a MyChart letter.

## 2023-08-23 ENCOUNTER — Ambulatory Visit (INDEPENDENT_AMBULATORY_CARE_PROVIDER_SITE_OTHER): Payer: Medicaid Other | Admitting: *Deleted

## 2023-08-23 ENCOUNTER — Other Ambulatory Visit (INDEPENDENT_AMBULATORY_CARE_PROVIDER_SITE_OTHER): Payer: Medicaid Other

## 2023-08-23 VITALS — BP 144/80 | HR 69 | Wt 326.0 lb

## 2023-08-23 DIAGNOSIS — Z3481 Encounter for supervision of other normal pregnancy, first trimester: Secondary | ICD-10-CM

## 2023-08-23 DIAGNOSIS — Z3A08 8 weeks gestation of pregnancy: Secondary | ICD-10-CM | POA: Diagnosis not present

## 2023-08-23 DIAGNOSIS — O0990 Supervision of high risk pregnancy, unspecified, unspecified trimester: Secondary | ICD-10-CM | POA: Insufficient documentation

## 2023-08-23 DIAGNOSIS — Z3A09 9 weeks gestation of pregnancy: Secondary | ICD-10-CM | POA: Diagnosis not present

## 2023-08-23 DIAGNOSIS — O99210 Obesity complicating pregnancy, unspecified trimester: Secondary | ICD-10-CM

## 2023-08-23 DIAGNOSIS — O3680X0 Pregnancy with inconclusive fetal viability, not applicable or unspecified: Secondary | ICD-10-CM

## 2023-08-23 DIAGNOSIS — D573 Sickle-cell trait: Secondary | ICD-10-CM

## 2023-08-23 DIAGNOSIS — O10919 Unspecified pre-existing hypertension complicating pregnancy, unspecified trimester: Secondary | ICD-10-CM

## 2023-08-23 NOTE — Progress Notes (Signed)
New OB Intake  I explained I am completing New OB Intake today. We discussed EDD of 03/27/2024, by Last Menstrual Period. Pt is G3P2002. I reviewed her allergies, medications and Medical/Surgical/OB history.    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  Patient also informed that the ultrasound is not being completed with the intent of assessing for fetal or placental anomalies or any pelvic abnormalities. Explained that the purpose of today's ultrasound is to assess for viability.  Patient acknowledges the purpose of the exam and the limitations of the study.     Delivery Plans Plans to deliver at [facility]. Discussed the nature of our practice with multiple providers including residents and students. Due to the size of the practice, the delivering provider may not be the same as those providing prenatal care.   MyChart/Babyscripts MyChart access verified. I explained pt will have some visits in office and some virtually. Babyscripts app discussed and ordered.   Blood Pressure Cuff Blood pressure cuff discussed and given. Discussed to be used for virtual visits and or if needed BP checks weekly.  Anatomy US Explained first scheduled [place] will be around 19 weeks.   Last Pap Unsure of last pap  First visit review I reviewed new OB appt with patient. Explained pt will be seen by Dr Macon Large at first visit. Discussed Avelina Laine genetic screening with patient . Routine prenatal labs to be collected at [facility].    Scheryl Marten, RN 08/23/2023   9:57 AM

## 2023-08-30 ENCOUNTER — Emergency Department: Payer: Medicaid Other

## 2023-08-30 ENCOUNTER — Other Ambulatory Visit: Payer: Self-pay

## 2023-08-30 ENCOUNTER — Emergency Department
Admission: EM | Admit: 2023-08-30 | Discharge: 2023-08-30 | Disposition: A | Discharge status: Home / Self Care | Payer: Medicaid Other | Attending: Emergency Medicine | Admitting: Emergency Medicine

## 2023-08-30 DIAGNOSIS — O99011 Anemia complicating pregnancy, first trimester: Secondary | ICD-10-CM | POA: Diagnosis not present

## 2023-08-30 DIAGNOSIS — O209 Hemorrhage in early pregnancy, unspecified: Secondary | ICD-10-CM | POA: Diagnosis present

## 2023-08-30 DIAGNOSIS — D649 Anemia, unspecified: Secondary | ICD-10-CM | POA: Insufficient documentation

## 2023-08-30 DIAGNOSIS — Z3A10 10 weeks gestation of pregnancy: Secondary | ICD-10-CM | POA: Diagnosis not present

## 2023-08-30 DIAGNOSIS — O208 Other hemorrhage in early pregnancy: Secondary | ICD-10-CM | POA: Diagnosis not present

## 2023-08-30 DIAGNOSIS — M791 Myalgia, unspecified site: Secondary | ICD-10-CM

## 2023-08-30 LAB — URINALYSIS, ROUTINE W REFLEX MICROSCOPIC
Bilirubin Urine: NEGATIVE
Glucose, UA: NEGATIVE mg/dL
Hgb urine dipstick: NEGATIVE
Ketones, ur: NEGATIVE mg/dL
Leukocytes,Ua: NEGATIVE
Nitrite: NEGATIVE
Protein, ur: NEGATIVE mg/dL
Specific Gravity, Urine: 1.014 (ref 1.005–1.030)
pH: 6 (ref 5.0–8.0)

## 2023-08-30 LAB — CBC
HCT: 31.2 % — ABNORMAL LOW (ref 36.0–46.0)
Hemoglobin: 10.4 g/dL — ABNORMAL LOW (ref 12.0–15.0)
MCH: 26.3 pg (ref 26.0–34.0)
MCHC: 33.3 g/dL (ref 30.0–36.0)
MCV: 79 fL — ABNORMAL LOW (ref 80.0–100.0)
Platelets: 277 10*3/uL (ref 150–400)
RBC: 3.95 MIL/uL (ref 3.87–5.11)
RDW: 15.9 % — ABNORMAL HIGH (ref 11.5–15.5)
WBC: 10.1 10*3/uL (ref 4.0–10.5)
nRBC: 0 % (ref 0.0–0.2)

## 2023-08-30 LAB — BASIC METABOLIC PANEL
Anion gap: 7 (ref 5–15)
BUN: 8 mg/dL (ref 6–20)
CO2: 21 mmol/L — ABNORMAL LOW (ref 22–32)
Calcium: 8.5 mg/dL — ABNORMAL LOW (ref 8.9–10.3)
Chloride: 106 mmol/L (ref 98–111)
Creatinine, Ser: 0.74 mg/dL (ref 0.44–1.00)
GFR, Estimated: >60 mL/min (ref >60)
Glucose, Bld: 117 mg/dL — ABNORMAL HIGH (ref 70–99)
Potassium: 3.7 mmol/L (ref 3.5–5.1)
Sodium: 134 mmol/L — ABNORMAL LOW (ref 135–145)

## 2023-08-30 LAB — ABO/RH: ABO/RH(D): O POS

## 2023-08-30 LAB — HCG, QUANTITATIVE, PREGNANCY: hCG, Beta Chain, Quant, S: 90532 m[IU]/mL — ABNORMAL HIGH (ref <5)

## 2023-08-30 MED ORDER — ACETAMINOPHEN 325 MG PO TABS: 650.0000 mg | ORAL_TABLET | Freq: Once | ORAL | Status: DC

## 2023-08-30 MED ORDER — ACETAMINOPHEN 325 MG PO TABS: 650.0000 mg | ORAL_TABLET | Freq: Four times a day (QID) | ORAL | Status: DC | PRN

## 2023-08-30 MED ORDER — ACETAMINOPHEN 325 MG PO TABS
650.0000 mg | ORAL_TABLET | Freq: Four times a day (QID) | ORAL | Status: AC | PRN
  Administered 2023-08-30: 650 mg via ORAL
  Filled 2023-08-30: qty 2

## 2023-08-30 NOTE — ED Triage Notes (Signed)
Pt to ED via POV from home. Pt reports vaginal bleeding that started today. Pt reports bleeding is light. Pt reports approximately 10wks today. Pt denies abd pain. Pt is G3P2. Pt also reports left sided body pain.

## 2023-08-30 NOTE — Discharge Instructions (Addendum)
Please make an appointment with your OB/GYN for a follow-up of the bleeding.  Please take iron, and diet with food rich in iron.  Please walk every hour and do not lay down for a  prolonged period of time.  Back to emergency department if any worsening symptoms or new symptoms.

## 2023-08-30 NOTE — ED Provider Notes (Addendum)
[facility] Provider Note    Event Date/Time   First MD Initiated Contact with Patient 08/30/23 1800     (approximate)   History   Vaginal Bleeding   HPI  Vanessa Conner is a 32 y.o. female presents today with history of pregnancy, like vaginal bleeding that is started today, and left posterior thorax pain associated with deep breathing and movement.  Pain radiates to the left shoulder and left breast.Patient refers shortness of breath, asthenia. Patient has history of  UTIs during her previous pregnancies.  Patient denies fever, nausea, vomit, diarrhea, sore throat, dysuria.  Physical Exam   Triage Vital Signs: ED Triage Vitals  Encounter Vitals Group     BP 08/30/23 1647 126/87     Systolic BP Percentile --      Diastolic BP Percentile --      Pulse Rate 08/30/23 1645 84     Resp 08/30/23 1645 20     Temp 08/30/23 1645 98.3 F (36.8 C)     Temp Source 08/30/23 1645 Oral     SpO2 08/30/23 1645 100 %     Weight --      Height --      Head Circumference --      Peak Flow --      Pain Score 08/30/23 1645 0     Pain Loc --      Pain Education --      Exclude from Growth Chart --     Most recent vital signs: Vitals:   08/30/23 1645 08/30/23 1647  BP:  126/87  Pulse: 84   Resp: 20   Temp: 98.3 F (36.8 C)   SpO2: 100%      Constitutional: Alert mild distress Eyes: Conjunctivae are normal.  Head: Atraumatic. Nose: No congestion/rhinnorhea. Mouth/Throat: Mucous membranes are moist.  No tonsillar exudates Neck: Painless ROM.  Cardiovascular: Anterior thorax nontender to palpation. good peripheral circulation.  Regular rhythm Respiratory: Normal respiratory effort.  No retractions.  No wheezing Gastrointestinal: Soft and nontender.  No signs of peritoneal irritation Musculoskeletal:  no deformity Left posterior thoracic area: Skin intact no hematoma, no ecchymosis, no tender to palpation.  Bilateral CVA tenderness  negative Neurologic:  MAE spontaneously. No gross focal neurologic deficits are appreciated.  Skin:  Skin is warm, dry and intact. No rash noted. Psychiatric: Mood and affect are normal. Speech and behavior are normal.    ED Results / Procedures / Treatments   Labs (all labs ordered are listed, but only abnormal results are displayed) Labs Reviewed  CBC - Abnormal; Notable for the following components:      Result Value   Hemoglobin 10.4 (*)    HCT 31.2 (*)    MCV 79.0 (*)    RDW 15.9 (*)    All other components within normal limits  BASIC METABOLIC PANEL - Abnormal; Notable for the following components:   Sodium 134 (*)    CO2 21 (*)    Glucose, Bld 117 (*)    Calcium 8.5 (*)    All other components within normal limits  HCG, QUANTITATIVE, PREGNANCY - Abnormal; Notable for the following components:   hCG, Beta Chain, Quant, S [id number] (*)    All other components within normal limits  URINALYSIS, ROUTINE W REFLEX MICROSCOPIC - Abnormal; Notable for the following components:   Color, Urine YELLOW (*)    APPearance HAZY (*)    All other components within normal limits  POC URINE PREG, ED  ABO/RH     EKG See providers note    RADIOLOGY I independently reviewed and interpreted imaging and agree with radiologists findings.      PROCEDURES:  Critical Care performed:   Procedures   MEDICATIONS ORDERED IN ED: Medications  acetaminophen (TYLENOL) tablet 650 mg (650 mg Oral Given 08/30/23 1850)     IMPRESSION / MDM / ASSESSMENT AND PLAN / ED COURSE  I reviewed the triage vital signs and the nursing notes.  Differential diagnosis includes, but is not limited to, anemia, UTI, threatened abortion,  muscular strain  Patient's presentation is most consistent with acute complicated illness / injury requiring diagnostic workup.   Patient's diagnosis is consistent with 10-week intrauterine pregnancy, small subchorionic hemorrhage, muscle strain anemia I independently  reviewed and interpreted imaging and agree with radiologists findings. Labs are rea reassuring. I did review the patient's allergies and medications. Patient will be discharged home  without . Patient is to follow up with OB/GYN as needed or otherwise directed. Patient is given ED precautions to return to the ED for any worsening or new symptoms. Discussed plan of care with patient, answered all of patient's questions, Patient agreeable to plan of care. Advised patient to take medications according to the instructions on the label. Discussed possible side effects of new medications. Patient verbalized understanding. Clinical Course as of 08/30/23 2317  Tue Aug 30, 2023  1852 CBC(!) Anemia [AE]  1855 hCG, quantitative, pregnancy(!) Positive for pregnancy [AE]  2147 US OB Comp Less 14 Wks Small subchorionic hemorrhage.  10-week pregnancy [AE]    Clinical Course User Index [AE] Gladys Damme, PA-C     FINAL CLINICAL IMPRESSION(S) / ED DIAGNOSES   Final diagnoses:  Subchorionic hemorrhage in first trimester  Anemia affecting pregnancy in first trimester  Muscular pain     Rx / DC Orders   ED Discharge Orders     None        Note:  This document was prepared using Dragon voice recognition software and may include unintentional dictation errors.   Gladys Damme, PA-C 08/30/23 2317    Gladys Damme, PA-C 08/30/23 2351    Gladys Damme, PA-C 08/30/23 2354    Sharman Cheek, MD 09/02/23 1251

## 2023-09-01 ENCOUNTER — Emergency Department: Payer: Medicaid Other

## 2023-09-01 ENCOUNTER — Encounter: Payer: Self-pay | Admitting: Emergency Medicine

## 2023-09-01 ENCOUNTER — Other Ambulatory Visit: Payer: Self-pay

## 2023-09-01 ENCOUNTER — Emergency Department
Admission: EM | Admit: 2023-09-01 | Discharge: 2023-09-01 | Disposition: A | Discharge status: Home / Self Care | Payer: Medicaid Other | Attending: Emergency Medicine | Admitting: Emergency Medicine

## 2023-09-01 ENCOUNTER — Other Ambulatory Visit: Payer: Medicaid Other

## 2023-09-01 DIAGNOSIS — J189 Pneumonia, unspecified organism: Secondary | ICD-10-CM | POA: Insufficient documentation

## 2023-09-01 DIAGNOSIS — Z3A10 10 weeks gestation of pregnancy: Secondary | ICD-10-CM | POA: Diagnosis not present

## 2023-09-01 DIAGNOSIS — Z20822 Contact with and (suspected) exposure to covid-19: Secondary | ICD-10-CM | POA: Diagnosis not present

## 2023-09-01 DIAGNOSIS — O99511 Diseases of the respiratory system complicating pregnancy, first trimester: Secondary | ICD-10-CM | POA: Diagnosis present

## 2023-09-01 DIAGNOSIS — M546 Pain in thoracic spine: Secondary | ICD-10-CM | POA: Diagnosis not present

## 2023-09-01 DIAGNOSIS — O26891 Other specified pregnancy related conditions, first trimester: Secondary | ICD-10-CM | POA: Insufficient documentation

## 2023-09-01 LAB — RESP PANEL BY RT-PCR (RSV, FLU A&B, COVID)  RVPGX2
Influenza A by PCR: NEGATIVE
Influenza B by PCR: NEGATIVE
Resp Syncytial Virus by PCR: NEGATIVE
SARS Coronavirus 2 by RT PCR: NEGATIVE

## 2023-09-01 LAB — CBC WITH DIFFERENTIAL/PLATELET
Abs Immature Granulocytes: 0.02 10*3/uL (ref 0.00–0.07)
Basophils Absolute: 0.1 10*3/uL (ref 0.0–0.1)
Basophils Relative: 1 %
Eosinophils Absolute: 0.1 10*3/uL (ref 0.0–0.5)
Eosinophils Relative: 1 %
HCT: 30 % — ABNORMAL LOW (ref 36.0–46.0)
Hemoglobin: 10.2 g/dL — ABNORMAL LOW (ref 12.0–15.0)
Immature Granulocytes: 0 %
Lymphocytes Relative: 24 %
Lymphs Abs: 2.2 10*3/uL (ref 0.7–4.0)
MCH: 26.6 pg (ref 26.0–34.0)
MCHC: 34 g/dL (ref 30.0–36.0)
MCV: 78.1 fL — ABNORMAL LOW (ref 80.0–100.0)
Monocytes Absolute: 0.8 10*3/uL (ref 0.1–1.0)
Monocytes Relative: 9 %
Neutro Abs: 5.8 10*3/uL (ref 1.7–7.7)
Neutrophils Relative %: 65 %
Platelets: 286 10*3/uL (ref 150–400)
RBC: 3.84 MIL/uL — ABNORMAL LOW (ref 3.87–5.11)
RDW: 15.9 % — ABNORMAL HIGH (ref 11.5–15.5)
WBC: 8.9 10*3/uL (ref 4.0–10.5)
nRBC: 0 % (ref 0.0–0.2)

## 2023-09-01 LAB — BASIC METABOLIC PANEL
Anion gap: 10 (ref 5–15)
BUN: 8 mg/dL (ref 6–20)
CO2: 19 mmol/L — ABNORMAL LOW (ref 22–32)
Calcium: 8.6 mg/dL — ABNORMAL LOW (ref 8.9–10.3)
Chloride: 105 mmol/L (ref 98–111)
Creatinine, Ser: 0.57 mg/dL (ref 0.44–1.00)
GFR, Estimated: >60 mL/min (ref >60)
Glucose, Bld: 90 mg/dL (ref 70–99)
Potassium: 3.8 mmol/L (ref 3.5–5.1)
Sodium: 134 mmol/L — ABNORMAL LOW (ref 135–145)

## 2023-09-01 LAB — LACTIC ACID, PLASMA: Lactic Acid, Venous: 0.7 mmol/L (ref 0.5–1.9)

## 2023-09-01 LAB — BRAIN NATRIURETIC PEPTIDE: B Natriuretic Peptide: 20 pg/mL (ref 0.0–100.0)

## 2023-09-01 MED ORDER — ACETAMINOPHEN 500 MG PO TABS
1000.0000 mg | ORAL_TABLET | Freq: Once | ORAL | Status: AC
Start: 2023-09-01 — End: 2023-09-01
  Administered 2023-09-01: 1000 mg via ORAL
  Filled 2023-09-01: qty 2

## 2023-09-01 MED ORDER — AZITHROMYCIN 250 MG PO TABS: 250.0000 mg | ORAL_TABLET | Freq: Every day | ORAL | 0 refills | Status: AC

## 2023-09-01 MED ORDER — ALBUTEROL SULFATE (2.5 MG/3ML) 0.083% IN NEBU
2.5000 mg | INHALATION_SOLUTION | Freq: Once | RESPIRATORY_TRACT | Status: AC
  Administered 2023-09-01: 2.5 mg via RESPIRATORY_TRACT
  Filled 2023-09-01: qty 3

## 2023-09-01 MED ORDER — AZITHROMYCIN 500 MG PO TABS
500.0000 mg | ORAL_TABLET | Freq: Once | ORAL | Status: AC
  Administered 2023-09-01: 500 mg via ORAL
  Filled 2023-09-01: qty 1

## 2023-09-01 MED ORDER — ALBUTEROL SULFATE HFA 108 (90 BASE) MCG/ACT IN AERS: 2.0000 | INHALATION_SPRAY | RESPIRATORY_TRACT | 0 refills | Status: AC | PRN

## 2023-09-01 MED ORDER — VENTOLIN HFA 108 (90 BASE) MCG/ACT IN AERS: 2.0000 | INHALATION_SPRAY | RESPIRATORY_TRACT | 0 refills | Status: DC | PRN

## 2023-09-01 NOTE — Discharge Instructions (Signed)
Take the antibiotic as prescribed and finish the full course.  Use the albuterol inhaler up to every 4-6 hours as needed for shortness of breath.  Return to the ER immediately for new, worsening, or persistent severe shortness of breath, chest pain, fever, weakness or lethargy, vomiting or if you are unable to take the medication, or any other new or worsening symptoms that concern you.  Follow-up with your OB/GYN as scheduled.

## 2023-09-01 NOTE — ED Notes (Signed)
 Patient discharged from ED by provider. Discharge instructions reviewed with patient and all questions answered. Patient ambulatory from ED in NAD.

## 2023-09-01 NOTE — ED Triage Notes (Addendum)
Pt in with co shob and left rib pain. Seen here for the same 2 days ago and dx with muscular pain. Pt here for persistent pain and shob. NO distress noted in triage. Pt is [redacted] weeks pregnant.

## 2023-09-01 NOTE — ED Provider Notes (Signed)
[facility] Provider Note    Event Date/Time   First MD Initiated Contact with Patient 09/01/23 1513     (approximate)   History   Shortness of Breath   HPI  Vanessa Conner is a 32 y.o. female [redacted] weeks pregnant by ultrasound who presents with left chest wall and upper back pain for the last several days, constant, worse with certain positions or with raising her arm.  It is sharp in quality.  It is associated with shortness of breath which is present at rest but worsens with exertion.  She also reports a nonproductive cough.  She has not had any fever or chills.  She has no vomiting or diarrhea.  She denies any leg swelling or pain.  Patient had been having some vaginal bleeding but this has subsided.  She has no abdominal pain.  I reviewed the past medical records.  The patient was seen in the ED on 12/24 with vaginal bleeding and left posterior thorax area pain.  Ultrasound at that time showed a 10-week IUP with subchorionic hemorrhage.   Physical Exam   Triage Vital Signs: ED Triage Vitals [09/01/23 1309]  Encounter Vitals Group     BP (!) 152/85     Systolic BP Percentile      Diastolic BP Percentile      Pulse Rate 88     Resp 18     Temp 98.4 F (36.9 C)     Temp Source Oral     SpO2 99 %     Weight (!) 326 lb (147.9 kg)     Height 5\' 7"  (1.702 m)     Head Circumference      Peak Flow      Pain Score 3     Pain Loc      Pain Education      Exclude from Growth Chart     Most recent vital signs: Vitals:   09/01/23 1309 09/01/23 1739  BP: (!) 152/85 (!) 141/87  Pulse: 88 74  Resp: 18 18  Temp: 98.4 F (36.9 C) 98.2 F (36.8 C)  SpO2: 99% 100%     General: Awake, no distress.  CV:  Good peripheral perfusion.  Resp:  Normal effort.  Abd:  No distention. Other:  Mild left chest wall tenderness reproducing the pain.  Pain reproduced on abduction of the left arm.  No midline spinal tenderness.  Lungs with somewhat diminished  breath sounds bilaterally.  No significant wheezes or rales.  No calf or popliteal swelling.   ED Results / Procedures / Treatments   Labs (all labs ordered are listed, but only abnormal results are displayed) Labs Reviewed  BASIC METABOLIC PANEL - Abnormal; Notable for the following components:      Result Value   Sodium 134 (*)    CO2 19 (*)    Calcium 8.6 (*)    All other components within normal limits  CBC WITH DIFFERENTIAL/PLATELET - Abnormal; Notable for the following components:   RBC 3.84 (*)    Hemoglobin 10.2 (*)    HCT 30.0 (*)    MCV 78.1 (*)    RDW 15.9 (*)    All other components within normal limits  RESP PANEL BY RT-PCR (RSV, FLU A&B, COVID)  [id number]  LACTIC ACID, PLASMA  BRAIN NATRIURETIC PEPTIDE     EKG  ED ECG REPORT I, Dionne Bucy, the attending physician, personally viewed and interpreted this ECG.  Date: 09/01/2023 EKG Time:  1312 Rate: 87 Rhythm: normal sinus rhythm QRS Axis: normal Intervals: normal ST/T Wave abnormalities: normal Narrative Interpretation: no evidence of acute ischemia    RADIOLOGY  Chest x-ray: I independently viewed and interpreted the images; there is bilateral patchy opacity, atelectasis versus infiltrate, worse on the left  PROCEDURES:  Critical Care performed: No  Procedures   MEDICATIONS ORDERED IN ED: Medications  albuterol (PROVENTIL) (2.5 MG/3ML) 0.083% nebulizer solution 2.5 mg (2.5 mg Nebulization Given 09/01/23 1600)  acetaminophen (TYLENOL) tablet 1,000 mg (1,000 mg Oral Given 09/01/23 1600)  azithromycin (ZITHROMAX) tablet 500 mg (500 mg Oral Given 09/01/23 1846)     IMPRESSION / MDM / ASSESSMENT AND PLAN / ED COURSE  I reviewed the triage vital signs and the nursing notes.  32 year old female with PMH as noted above, currently [redacted] weeks pregnant, presents with left chest wall pain for the last several days associated with shortness of breath and nonproductive cough.  On exam the patient is  overall well-appearing.  She is slightly hypertensive with otherwise normal vital signs.  Lungs reveal diminished breath sounds but no wheezes or rales.  The pain is somewhat reproducible with position and movement of the left arm.  Differential diagnosis includes, but is not limited to, musculoskeletal chest wall pain, acute bronchitis, pneumonia.  I have a low suspicion for PE given that the patient has no DVT symptoms and no tachycardia or hypoxia as well as the presence of a cough.  There is no evidence of ACS.  EKG is nonischemic.  The patient has been taking Tylenol intermittently with minimal relief.  We will give a dose of Tylenol here, a trial of albuterol, obtain a chest x-ray, and reassess.  Patient's presentation is most consistent with acute complicated illness / injury requiring diagnostic workup.  ----------------------------------------- 7:01 PM on 09/01/2023 -----------------------------------------  Chest x-ray shows bilateral patchy opacities, mainly on the left, either atelectasis versus infiltrate.  Given the patient's cough and pain in the area, this is most consistent with pneumonia.  I obtained lab workup which is reassuring.  BNP is negative, and there is no evidence of cardiomyopathy or CHF.  Lactate is normal.  There is no leukocytosis.  Electrolytes and renal function are normal.  I had an extensive discussion with the patient about the plan of care.  I did consider whether the patient may benefit from inpatient admission given possible bilateral CAP in pregnancy.  However, given that the patient is not requiring oxygen, has normal vital signs, reassuring lab workup, no evidence of systemic infection or sepsis, is quite early in the pregnancy, and is able to ambulate without difficulty, I feel that discharge is appropriate.  I did offer the patient inpatient admission but she states that she feels comfortable going home with a trial of oral antibiotics.  I gave her extensive  return precautions and she expressed understanding.  I also had an extensive discussion with the patient about possible workup for PE; via shared decision making, we will not pursue further workup at this time.  As above, there is no clinical evidence for PE; the patient is not tachycardic or hypoxic.  She has no leg swelling to suggest DVT.  Especially given the presence of cough and the chest x-ray findings, overall presentation is most consistent with CAP.  There is no indication for D-dimer which will likely be positive in pregnancy and thus nondiagnostic, and no indication for DVT studies or CT angio of the chest in this pregnant patient.   FINAL  CLINICAL IMPRESSION(S) / ED DIAGNOSES   Final diagnoses:  Community acquired pneumonia, unspecified laterality     Rx / DC Orders   ED Discharge Orders          Ordered    azithromycin (ZITHROMAX Z-PAK) 250 MG tablet  Daily        09/01/23 1900    albuterol (VENTOLIN HFA) 108 (90 Base) MCG/ACT inhaler  Every 4 hours PRN,   Status:  Discontinued        09/01/23 1900    albuterol (VENTOLIN HFA) 108 (90 Base) MCG/ACT inhaler  Every 4 hours PRN        09/01/23 1905             Note:  This document was prepared using Dragon voice recognition software and may include unintentional dictation errors.    Dionne Bucy, MD 09/01/23 [phone]

## 2023-09-09 ENCOUNTER — Encounter: Payer: Medicaid Other | Admitting: Obstetrics

## 2023-09-13 ENCOUNTER — Encounter: Payer: Self-pay | Admitting: Obstetrics & Gynecology

## 2023-09-13 ENCOUNTER — Other Ambulatory Visit (HOSPITAL_COMMUNITY)
Admission: RE | Admit: 2023-09-13 | Discharge: 2023-09-13 | Disposition: A | Discharge status: Home / Self Care | Payer: Medicaid Other | Source: Ambulatory Visit | Attending: Obstetrics & Gynecology | Admitting: Obstetrics & Gynecology

## 2023-09-13 ENCOUNTER — Ambulatory Visit (INDEPENDENT_AMBULATORY_CARE_PROVIDER_SITE_OTHER): Payer: Medicaid Other | Admitting: Obstetrics & Gynecology

## 2023-09-13 ENCOUNTER — Encounter: Payer: Medicaid Other | Admitting: Obstetrics & Gynecology

## 2023-09-13 VITALS — BP 141/82 | HR 84 | Wt 324.0 lb

## 2023-09-13 DIAGNOSIS — O0991 Supervision of high risk pregnancy, unspecified, first trimester: Secondary | ICD-10-CM | POA: Diagnosis not present

## 2023-09-13 DIAGNOSIS — O10911 Unspecified pre-existing hypertension complicating pregnancy, first trimester: Secondary | ICD-10-CM

## 2023-09-13 DIAGNOSIS — Z23 Encounter for immunization: Secondary | ICD-10-CM

## 2023-09-13 DIAGNOSIS — Z3A12 12 weeks gestation of pregnancy: Secondary | ICD-10-CM | POA: Insufficient documentation

## 2023-09-13 DIAGNOSIS — O99211 Obesity complicating pregnancy, first trimester: Secondary | ICD-10-CM

## 2023-09-13 DIAGNOSIS — O99210 Obesity complicating pregnancy, unspecified trimester: Secondary | ICD-10-CM | POA: Insufficient documentation

## 2023-09-13 DIAGNOSIS — O99011 Anemia complicating pregnancy, first trimester: Secondary | ICD-10-CM | POA: Insufficient documentation

## 2023-09-13 DIAGNOSIS — O10919 Unspecified pre-existing hypertension complicating pregnancy, unspecified trimester: Secondary | ICD-10-CM

## 2023-09-13 DIAGNOSIS — O0990 Supervision of high risk pregnancy, unspecified, unspecified trimester: Secondary | ICD-10-CM | POA: Diagnosis present

## 2023-09-13 DIAGNOSIS — G4730 Sleep apnea, unspecified: Secondary | ICD-10-CM | POA: Insufficient documentation

## 2023-09-13 MED ORDER — FERRIC MALTOL 30 MG PO CAPS: 1.0000 | ORAL_CAPSULE | Freq: Two times a day (BID) | ORAL | 2 refills | Status: DC

## 2023-09-13 MED ORDER — ASPIRIN 81 MG PO TBEC: 162.0000 mg | DELAYED_RELEASE_TABLET | Freq: Every day | ORAL | 2 refills | Status: DC

## 2023-09-13 MED ORDER — LABETALOL HCL 200 MG PO TABS: 400.0000 mg | ORAL_TABLET | Freq: Two times a day (BID) | ORAL | 3 refills | Status: AC

## 2023-09-13 NOTE — Progress Notes (Signed)
 History:   Vanessa Conner is a 33 y.o. G3P2002 at [redacted]w[redacted]d by LMP, early ultrasound being seen today for her first obstetrical visit.  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 Never had preeclampsia.  Recently had pneumonia, negative respiratory panel, treated with Azithromycin  and Albuterol .  Patient does intend to breast feed. Pregnancy history fully reviewed.  Patient reports  increased headaches, thinks BP is up .  No other symptoms.      HISTORY: OB History  Gravida Para Term Preterm AB Living  3 2 2  0 0 2  SAB IAB Ectopic Multiple Live Births  0 0 0 0 2    # Outcome Date GA Lbr Len/2nd Weight Sex Type Anes PTL Lv  3 Current           2 Term 10/12/10    F Vag-Spont   LIV  1 Term 08/15/09    M Vag-Spont   LIV  Last pap smear was done about 4 years ago and was normal. Defers pap until postpartum.  Past Medical History:  Diagnosis Date   Hypertension    Sickle cell trait (HCC)    Sleep apnea    Past Surgical History:  Procedure Laterality Date   LAPAROSCOPIC GASTRIC SLEEVE RESECTION  2022   No family history on file. Social History   Tobacco Use   Smoking status: Never   Smokeless tobacco: Never  Vaping Use   Vaping status: Never Used  Substance Use Topics   Alcohol use: Not Currently   Drug use: No   Allergies  Allergen Reactions   Amoxicillin     Current Outpatient Medications on File Prior to  Visit  Medication Sig Dispense Refill   Prenatal Vit-Fe Fumarate-FA (MULTIVITAMIN-PRENATAL) 27-0.8 MG TABS tablet Take 1 tablet by mouth daily at 12 noon.     albuterol  (VENTOLIN  HFA) 108 (90 Base) MCG/ACT inhaler Inhale 2 puffs into the lungs every 4 (four) hours as needed. (Patient not taking: Reported on 09/13/2023) 8 g 0   No current facility-administered medications on file prior to visit.    Review of Systems Pertinent items noted in HPI and remainder of comprehensive ROS otherwise negative.  Physical Exam:   Vitals:   09/13/23 1008  BP: (!) 141/82  Pulse: 84  Weight: (!) 324 lb (147 kg)   Fetal Heart Rate (bpm): 174  on  bedside ultrasound for FHR check: General: well-developed, well-nourished female in no acute distress  Breasts:  deferred  Skin: normal coloration and turgor, no rashes  Neurologic: oriented, normal, negative, normal mood  Extremities: normal strength, tone, and muscle mass, ROM of all joints is normal  HEENT PERRLA, extraocular movement intact and sclera clear, anicteric  Neck supple and no masses  Cardiovascular: regular rate and rhythm  Respiratory:  no respiratory distress, normal breath sounds  Abdomen: soft, non-tender; bowel sounds normal; no masses,  no organomegaly  Pelvic: deferred    Assessment:    Pregnancy: [id number] Patient  Active Problem List   Diagnosis Date Noted   Sleep apnea    Pneumonia complicating pregnancy in first trimester 09/01/2023   Supervision of high risk pregnancy, antepartum 08/23/2023   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 Chronic hypertension during pregnancy, antepartum (Primary) Increased Labetalol  to 400 mg bid, started on Aspirin  162 mg po every day for preeclampsia  prophylaxis. Baseline labs ordered.  Referral made to Cardio Obstetrics.  Discussed implications of CHTN in pregnancy, need for antenatal testing and frequent ultrasounds/prenatal visits, need for optimizing BP control to decrease CHTN/preeclampsia associated maternal-fetal morbidity and mortality. Continue home BP weekly checks. - CBC/D/Plt+RPR+Rh+ABO+RubIgG... - Culture, OB Urine - Hemoglobin A1c - PANORAMA PRENATAL TEST - Comprehensive metabolic panel - Protein / creatinine ratio, urine - TSH Rfx on Abnormal to Free T4 - aspirin  EC 81 MG tablet; Take 2 tablets (162 mg total) by mouth at bedtime. Start taking when you are [redacted] weeks pregnant for rest of pregnancy for prevention of preeclampsia  Dispense: 300 tablet; Refill: 2 - AMB Referral to Cardio Obstetrics - US  MFM OB DETAIL +14 WK; Future - labetalol  (NORMODYNE ) 200 MG tablet; Take 2 tablets (400 mg total) by mouth 2 (two) times daily.  Dispense: 120 tablet; Refill: 3  2. 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   Recommended TWG 11-20 lbs, shedeclined Nutritionist referral for now. - Hemoglobin A1c - Comprehensive metabolic panel - TSH Rfx on Abnormal to Free T4 - aspirin  EC 81 MG tablet; Take 2 tablets (162 mg total) by mouth at bedtime. Start taking when you are [redacted] weeks pregnant for rest of pregnancy for prevention of preeclampsia  Dispense: 300 tablet; Refill: 2 - AMB Referral to Cardio Obstetrics - US  MFM OB DETAIL +14 WK; Future  3. Anemia affecting pregnancy in first trimester Hgb 10.2 on recent labs, will check Ferritin levels with labs today. Accrufer  prescribed. - Ferritin - Ferric Maltol  30 MG CAPS; Take 1 capsule (30 mg total) by mouth 2 (two) times daily. Please take one hour before breakfast and dinner  Dispense: 60 capsule; Refill: 2  4. Flu vaccine need Flu vaccine given. - Flu vaccine trivalent PF, 6mos and  older(Flulaval,Afluria,Fluarix,Fluzone)  5. [redacted] weeks gestation of pregnancy 6. Supervision of high risk pregnancy, antepartum - CBC/D/Plt+RPR+Rh+ABO+RubIgG... - Culture, OB Urine - PANORAMA PRENATAL TEST - Protein / creatinine ratio, urine - aspirin  EC 81 MG tablet; Take 2 tablets (162 mg total) by mouth at bedtime. Start taking when you are [redacted] weeks pregnant for rest of pregnancy for prevention of preeclampsia  Dispense: 300 tablet; Refill: 2 - AMB Referral to Cardio Obstetrics - US  MFM OB DETAIL +14 WK; Future - Cervicovaginal ancillary only  Initial labs drawn. Continue prenatal vitamins. Problem list reviewed and updated. Genetic Screening discussed, Panorama: ordered. Ultrasound discussed; fetal anatomic survey: scheduled. Anticipatory guidance about prenatal visits given including labs, ultrasounds, and testing. Discussed usage of the Babyscripts app for more information about pregnancy, and to track blood pressures. Also discussed usage of virtual visits as additional source of managing and completing prenatal visits.  Patient was encouraged to use MyChart to review results, send requests, and have questions addressed.   The nature of Center for [facility] with multiple MDs  and Advanced Practice Providers was explained to patient; also emphasized that residents, students are part of our team. Routine obstetric precautions reviewed. Encouraged to seek out care at our office or emergency room [facility] preferred) for urgent and/or emergent concerns. Return in about 4 weeks (around 10/11/2023) for OFFICE OB VISIT (MD only).     GLORIS HUGGER, MD, FACOG Obstetrician & Gynecologist, [facility] [employer], [facility]

## 2023-09-14 ENCOUNTER — Ambulatory Visit: Payer: Medicaid Other

## 2023-09-14 ENCOUNTER — Other Ambulatory Visit: Payer: Medicaid Other

## 2023-09-14 ENCOUNTER — Institutional Professional Consult (permissible substitution): Payer: Medicaid Other

## 2023-09-14 LAB — CBC/D/PLT+RPR+RH+ABO+RUBIGG...
Antibody Screen: NEGATIVE
Basophils Absolute: 0 10*3/uL (ref 0.0–0.2)
Basos: 0 %
EOS (ABSOLUTE): 0.1 10*3/uL (ref 0.0–0.4)
Eos: 1 %
HCV Ab: NONREACTIVE
HIV Screen 4th Generation wRfx: NONREACTIVE
Hematocrit: 32.9 % — ABNORMAL LOW (ref 34.0–46.6)
Hemoglobin: 10.7 g/dL — ABNORMAL LOW (ref 11.1–15.9)
Hepatitis B Surface Ag: NEGATIVE
Immature Grans (Abs): 0 10*3/uL (ref 0.0–0.1)
Immature Granulocytes: 0 %
Lymphocytes Absolute: 2 10*3/uL (ref 0.7–3.1)
Lymphs: 25 %
MCH: 26 pg — ABNORMAL LOW (ref 26.6–33.0)
MCHC: 32.5 g/dL (ref 31.5–35.7)
MCV: 80 fL (ref 79–97)
Monocytes Absolute: 0.7 10*3/uL (ref 0.1–0.9)
Monocytes: 9 %
Neutrophils Absolute: 5.2 10*3/uL (ref 1.4–7.0)
Neutrophils: 65 %
Platelets: 358 10*3/uL (ref 150–450)
RBC: 4.11 x10E6/uL (ref 3.77–5.28)
RDW: 15.3 % (ref 11.7–15.4)
RPR Ser Ql: NONREACTIVE
Rh Factor: POSITIVE
Rubella Antibodies, IGG: 1.25 {index} (ref >0.99)
WBC: 8 10*3/uL (ref 3.4–10.8)

## 2023-09-14 LAB — COMPREHENSIVE METABOLIC PANEL
ALT: 13 [IU]/L (ref 0–32)
AST: 15 [IU]/L (ref 0–40)
Albumin: 3.5 g/dL — ABNORMAL LOW (ref 3.9–4.9)
Alkaline Phosphatase: 91 [IU]/L (ref 44–121)
BUN/Creatinine Ratio: 9 (ref 9–23)
BUN: 6 mg/dL (ref 6–20)
Bilirubin Total: 0.2 mg/dL (ref 0.0–1.2)
CO2: 19 mmol/L — ABNORMAL LOW (ref 20–29)
Calcium: 9.1 mg/dL (ref 8.7–10.2)
Chloride: 105 mmol/L (ref 96–106)
Creatinine, Ser: 0.67 mg/dL (ref 0.57–1.00)
Globulin, Total: 2.9 g/dL (ref 1.5–4.5)
Glucose: 93 mg/dL (ref 70–99)
Potassium: 4.1 mmol/L (ref 3.5–5.2)
Sodium: 136 mmol/L (ref 134–144)
Total Protein: 6.4 g/dL (ref 6.0–8.5)
eGFR: 118 mL/min/{1.73_m2} (ref >59)

## 2023-09-14 LAB — CERVICOVAGINAL ANCILLARY ONLY
Chlamydia: NEGATIVE
Comment: NEGATIVE
Comment: NEGATIVE
Comment: NORMAL
Neisseria Gonorrhea: NEGATIVE
Trichomonas: NEGATIVE

## 2023-09-14 LAB — HEMOGLOBIN A1C
Est. average glucose Bld gHb Est-mCnc: 105 mg/dL
Hgb A1c MFr Bld: 5.3 % (ref 4.8–5.6)

## 2023-09-14 LAB — PROTEIN / CREATININE RATIO, URINE
Creatinine, Urine: 171.1 mg/dL
Protein, Ur: 13.2 mg/dL
Protein/Creat Ratio: 77 mg/g{creat} (ref 0–200)

## 2023-09-14 LAB — TSH RFX ON ABNORMAL TO FREE T4: TSH: 0.618 u[IU]/mL (ref 0.450–4.500)

## 2023-09-14 LAB — HCV INTERPRETATION

## 2023-09-14 LAB — FERRITIN: Ferritin: 26 ng/mL (ref 15–150)

## 2023-09-15 ENCOUNTER — Encounter: Payer: Self-pay | Admitting: Obstetrics & Gynecology

## 2023-09-15 LAB — CULTURE, OB URINE

## 2023-09-15 LAB — URINE CULTURE, OB REFLEX

## 2023-09-18 LAB — PANORAMA PRENATAL TEST FULL PANEL:PANORAMA TEST PLUS 5 ADDITIONAL MICRODELETIONS: FETAL FRACTION: 3.8

## 2023-09-19 ENCOUNTER — Other Ambulatory Visit: Payer: Self-pay

## 2023-09-19 ENCOUNTER — Encounter: Payer: Self-pay | Admitting: Emergency Medicine

## 2023-09-19 ENCOUNTER — Emergency Department
Admission: EM | Admit: 2023-09-19 | Discharge: 2023-09-19 | Discharge status: Against Medical Advice | Payer: Medicaid Other | Attending: Student | Admitting: Student

## 2023-09-19 DIAGNOSIS — O039 Complete or unspecified spontaneous abortion without complication: Secondary | ICD-10-CM | POA: Insufficient documentation

## 2023-09-19 DIAGNOSIS — Z5321 Procedure and treatment not carried out due to patient leaving prior to being seen by health care provider: Secondary | ICD-10-CM | POA: Diagnosis not present

## 2023-09-19 LAB — ABO/RH: ABO/RH(D): O POS

## 2023-09-19 LAB — CBC
HCT: 33.2 % — ABNORMAL LOW (ref 36.0–46.0)
Hemoglobin: 11.2 g/dL — ABNORMAL LOW (ref 12.0–15.0)
MCH: 26 pg (ref 26.0–34.0)
MCHC: 33.7 g/dL (ref 30.0–36.0)
MCV: 77.2 fL — ABNORMAL LOW (ref 80.0–100.0)
Platelets: 316 10*3/uL (ref 150–400)
RBC: 4.3 MIL/uL (ref 3.87–5.11)
RDW: 15.8 % — ABNORMAL HIGH (ref 11.5–15.5)
WBC: 10.3 10*3/uL (ref 4.0–10.5)
nRBC: 0 % (ref 0.0–0.2)

## 2023-09-19 NOTE — ED Provider Triage Note (Addendum)
 Emergency Medicine Provider Triage Evaluation Note  Vanessa Conner , a 33 y.o. female  was evaluated in triage.  Pt presents with history of 24-hour of, vaginal bleeding, miscarriage.  Patient was [redacted] weeks pregnant.  Patient brought the fetus.  Review of Systems  Positive:  Negative:   Physical Exam  BP (!) 128/95 (BP Location: Left Arm)   Pulse (!) 104   Temp 98.2 F (36.8 C) (Oral)   Resp 15   Ht 5' 7 (1.702 m)   Wt (!) 147 kg   LMP 06/21/2023 Comment: Miscarried 09/18/2023  SpO2 97%   Breastfeeding Unknown   BMI 50.75 kg/m  Gen:   Awake, no distress   Resp:  Normal effort  MSK:   Moves extremities without difficulty  Other:    Medical Decision Making  Medically screening exam initiated at 3:43 PM.  Appropriate orders placed.  Vanessa Conner was informed that the remainder of the evaluation will be completed by another provider, this initial triage assessment does not replace that evaluation, and the importance of remaining in the ED until their evaluation is complete. Patient with pregnancy loss ordered CBC.   Vanessa Kast, PA-C 09/19/23 1547    Vanessa Kast, PA-C 09/19/23 1553

## 2023-09-19 NOTE — ED Triage Notes (Addendum)
 Pt in via POV, reports being [redacted] weeks pregnant, starting w/ pelvic pain and vaginal bleeding yesterday.  Patient has brought in passed fetus; placed in labeled products of conception container per this RN.    Patient states bleeding and pain are very minimal at this time.  Patient tearful in triage.

## 2023-10-10 ENCOUNTER — Emergency Department
Admission: EM | Admit: 2023-10-10 | Discharge: 2023-10-11 | Disposition: A | Discharge status: Home / Self Care | Payer: Medicaid Other | Attending: Emergency Medicine | Admitting: Emergency Medicine

## 2023-10-10 ENCOUNTER — Other Ambulatory Visit: Payer: Self-pay

## 2023-10-10 DIAGNOSIS — Z7982 Long term (current) use of aspirin: Secondary | ICD-10-CM | POA: Diagnosis not present

## 2023-10-10 DIAGNOSIS — O034 Incomplete spontaneous abortion without complication: Secondary | ICD-10-CM | POA: Diagnosis not present

## 2023-10-10 DIAGNOSIS — I10 Essential (primary) hypertension: Secondary | ICD-10-CM | POA: Insufficient documentation

## 2023-10-10 DIAGNOSIS — N390 Urinary tract infection, site not specified: Secondary | ICD-10-CM

## 2023-10-10 DIAGNOSIS — N898 Other specified noninflammatory disorders of vagina: Secondary | ICD-10-CM | POA: Diagnosis present

## 2023-10-10 DIAGNOSIS — Z79899 Other long term (current) drug therapy: Secondary | ICD-10-CM | POA: Diagnosis not present

## 2023-10-10 DIAGNOSIS — B9689 Other specified bacterial agents as the cause of diseases classified elsewhere: Secondary | ICD-10-CM | POA: Insufficient documentation

## 2023-10-10 DIAGNOSIS — N760 Acute vaginitis: Secondary | ICD-10-CM

## 2023-10-10 LAB — URINALYSIS, ROUTINE W REFLEX MICROSCOPIC
Bilirubin Urine: NEGATIVE
Glucose, UA: NEGATIVE mg/dL
Ketones, ur: NEGATIVE mg/dL
Nitrite: NEGATIVE
Protein, ur: NEGATIVE mg/dL
Specific Gravity, Urine: 1.014 (ref 1.005–1.030)
pH: 6 (ref 5.0–8.0)

## 2023-10-10 LAB — CHLAMYDIA/NGC RT PCR (ARMC ONLY)
Chlamydia Tr: NOT DETECTED
N gonorrhoeae: NOT DETECTED

## 2023-10-10 LAB — POC URINE PREG, ED: Preg Test, Ur: NEGATIVE

## 2023-10-10 NOTE — ED Triage Notes (Signed)
MIscarriage 1/11-12.  Noticing a foul smelling vaginal discharge x3-4 days. Denies dysuria.

## 2023-10-10 NOTE — ED Provider Triage Note (Signed)
Emergency Medicine Provider Triage Evaluation Note  Vanessa Conner , a 33 y.o. female  was evaluated in triage.  Pt complains of 3 days of vaginal discharge malodorous  Review of Systems  Positive:  Negative:   Physical Exam  BP 119/74 (BP Location: Left Arm)   Pulse 98   Resp 16   LMP 06/21/2023 Comment: Miscarried 09/18/2023  SpO2 100%  Gen:   Awake, no distress   Resp:  Normal effort  MSK:   Moves extremities without difficulty  Other:    Medical Decision Making  Medically screening exam initiated at 5:33 PM.  Appropriate orders placed.  Vanessa Conner was informed that the remainder of the evaluation will be completed by another provider, this initial triage assessment does not replace that evaluation, and the importance of remaining in the ED until their evaluation is complete.  Patient with vaginal discharge, ordered wet prep, chlamydia and gonorrhea   Gladys Damme, PA-C 10/10/23 1734

## 2023-10-10 NOTE — Progress Notes (Deleted)
   PRENATAL VISIT NOTE  Subjective:  Vanessa Conner is a 33 y.o. G3P2002 at [redacted]w[redacted]d being seen today for ongoing prenatal care.  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  Patient reports {sx:14538}.   .  .   . Denies leaking of fluid.   The following portions of the patient's history were reviewed and updated as appropriate: allergies, current medications, past family history, past medical history, past social history, past surgical history and problem list.   Objective:  There were no vitals filed for this visit.  Fetal Status:           General:  Alert, oriented and cooperative. Patient is in no acute distress.  Skin: Skin is warm and dry. No rash noted.   Cardiovascular: Normal heart rate noted  Respiratory: Normal respiratory effort, no problems with respiration noted  Abdomen: Soft, gravid, appropriate for gestational age.         Assessment and Plan:  Pregnancy: G3P2002 at [redacted]w[redacted]d 1. Supervision of high risk pregnancy, antepartum (Primary) 2. [redacted] weeks gestation of pregnancy AFP discuss, patient *** Anatomy scheduled 3/6  3. Chronic hypertension during pregnancy, antepartum *** on labetalol  400mg  BID Normal baseline labs ldASA Antenatal testing, serial growth US , delivery by 39wk  4. BMI 50.0-59.9, adult (HCC) Early A1c 5.3 ldASA  5. Sickle cell trait (HCC) Partner testing discussed ***  6. Sleep apnea, unspecified type ***uses CPAP?  7. Adjustment disorder with mixed disturbance of emotions and conduct Reports mood is ***   {Routine Plan:28585}  {Blank  single:19197::Term,Preterm} labor symptoms and general obstetric precautions including but not limited to vaginal bleeding, contractions, leaking of fluid and fetal movement were reviewed in detail with the patient. Please refer to After Visit Summary for other counseling recommendations.   No follow-ups on file.  Future Appointments  Date Time Provider Department Center  10/11/2023  9:35 AM Erik Kieth BROCKS, MD [facility]  11/10/2023 10:15 AM [facility] NURSE [facility]  11/10/2023 10:30 AM [facility] US1 [facility]    Kieth BROCKS Erik, MD

## 2023-10-10 NOTE — ED Provider Notes (Signed)
[facility] Provider Note    Event Date/Time   First MD Initiated Contact with Patient 10/10/23 2332     (approximate)   History   Vaginal Discharge   HPI {Remember to add pertinent medical, surgical, social, and/or OB history to HPI:1} Vanessa Conner is a 33 y.o. female  ***       Past Medical History   Past Medical History:  Diagnosis Date  . Hypertension   . Sickle cell trait (HCC)   . Sleep apnea      Active Problem List   Patient Active Problem List   Diagnosis Date Noted  . Sleep apnea   . Pneumonia complicating pregnancy in first trimester 09/01/2023  . Supervision of high risk pregnancy, antepartum 08/23/2023  . 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 Chronic hypertension during pregnancy, antepartum 08/23/2023  . Sickle cell trait (HCC) 08/23/2023  . 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 LAPAROSCOPIC GASTRIC SLEEVE RESECTION  2022     Home Medications   Prior to Admission medications   Medication Sig Start Date End Date Taking? Authorizing Provider  albuterol (VENTOLIN HFA) 108 (90 Base) MCG/ACT inhaler Inhale 2 puffs into the lungs every 4 (four) hours as needed. Patient not taking: Reported on 09/13/2023 09/01/23   Dionne Bucy, MD  aspirin EC 81 MG tablet Take 2 tablets (162 mg total) by mouth at bedtime. Start taking when you are [redacted] weeks pregnant for rest of pregnancy for prevention of preeclampsia 09/13/23   Anyanwu, Jethro Bastos, MD  Ferric Maltol 30 MG CAPS Take 1 capsule (30 mg total) by mouth 2 (two) times daily. Please take one hour before breakfast and dinner 09/13/23   Anyanwu, Jethro Bastos, MD  labetalol (NORMODYNE) 200 MG tablet Take 2 tablets (400 mg total) by mouth 2 (two) times daily. 09/13/23   Anyanwu, Jethro Bastos, MD  Prenatal Vit-Fe Fumarate-FA  (MULTIVITAMIN-PRENATAL) 27-0.8 MG TABS tablet Take 1 tablet by mouth daily at 12 noon.    [provider]     Allergies  Amoxicillin   Family History  No family history on file.   Physical Exam  Triage Vital Signs: ED Triage Vitals  Encounter Vitals Group     BP 10/10/23 1728 119/74     Systolic BP Percentile --      Diastolic BP Percentile --      Pulse Rate 10/10/23 1727 98     Resp 10/10/23 1727 16     Temp --      Temp Source 10/10/23 1727 Oral     SpO2 10/10/23 1727 100 %     Weight --      Height --      Head Circumference --      Peak Flow --      Pain Score 10/10/23 1727 0     Pain Loc --      Pain Education --      Exclude from Growth Chart --     Updated Vital Signs: BP 119/74 (BP Location: Left Arm)   Pulse 98   Resp 16   LMP 06/21/2023 Comment: Miscarried 09/18/2023  SpO2 100%   {Only need to document appropriate and relevant physical exam:1} General: Awake, no distress. *** CV:  Good peripheral perfusion. *** Resp:  Normal effort. *** Abd:  No distention. ***  Other:  ***   ED Results / Procedures / Treatments  Labs (all labs ordered are listed, but only abnormal results are displayed) Labs Reviewed  URINALYSIS, ROUTINE W REFLEX MICROSCOPIC - Abnormal; Notable for the following components:      Result Value   Color, Urine YELLOW (*)    APPearance HAZY (*)    Hgb urine dipstick MODERATE (*)    Leukocytes,Ua TRACE (*)    Bacteria, UA RARE (*)    All other components within normal limits  CHLAMYDIA/NGC RT PCR ([facility] ONLY)            WET PREP, GENITAL  POC URINE PREG, ED     EKG  ***   RADIOLOGY *** {You MUST document your own interpretation of imaging, as well as the fact that you reviewed the radiologist's report!:1}  Official radiology report(s): No results found.   PROCEDURES:  Critical Care performed: {CriticalCareYesNo:19197::"Yes, see critical care procedure note(s)","No"}  Procedures   MEDICATIONS ORDERED IN  ED: Medications - No data to display   IMPRESSION / MDM / ASSESSMENT AND PLAN / ED COURSE  I reviewed the triage vital signs and the nursing notes.                              Differential diagnosis includes, but is not limited to, ***  Patient's presentation is most consistent with {EM COPA:27473}  {If the patient is on the monitor, remove the brackets and asterisks on the sentence below and remember to document it as a Procedure as well. Otherwise delete the sentence below:1} {**The patient is on the cardiac monitor to evaluate for evidence of arrhythmia and/or significant heart rate changes.**}  {Remember to include, when applicable, any/all of the following data: independent review of imaging independent review of labs (comment specifically on pertinent positives and negatives) review of specific prior hospitalizations, PCP/specialist notes, etc. discuss meds given and prescribed document any discussion with consultants (including hospitalists) any clinical decision tools you used and why (PECARN, NEXUS, etc.) did you consider admitting the patient? document social determinants of health affecting patient's care (homelessness, inability to follow up in a timely fashion, etc) document any pre-existing conditions increasing risk on current visit (e.g. diabetes and HTN increasing danger of high-risk chest pain/ACS) describes what meds you gave (especially parenteral) and why any other interventions?:1}      FINAL CLINICAL IMPRESSION(S) / ED DIAGNOSES   Final diagnoses:  None     Rx / DC Orders   ED Discharge Orders     None        Note:  This document was prepared using Dragon voice recognition software and may include unintentional dictation errors.

## 2023-10-11 ENCOUNTER — Ambulatory Visit: Payer: Medicaid Other | Admitting: Obstetrics and Gynecology

## 2023-10-11 ENCOUNTER — Emergency Department: Payer: Medicaid Other

## 2023-10-11 DIAGNOSIS — Z6841 Body Mass Index (BMI) 40.0 and over, adult: Secondary | ICD-10-CM

## 2023-10-11 DIAGNOSIS — D573 Sickle-cell trait: Secondary | ICD-10-CM

## 2023-10-11 DIAGNOSIS — O0990 Supervision of high risk pregnancy, unspecified, unspecified trimester: Secondary | ICD-10-CM

## 2023-10-11 DIAGNOSIS — F4325 Adjustment disorder with mixed disturbance of emotions and conduct: Secondary | ICD-10-CM

## 2023-10-11 DIAGNOSIS — Z3A16 16 weeks gestation of pregnancy: Secondary | ICD-10-CM

## 2023-10-11 DIAGNOSIS — O10919 Unspecified pre-existing hypertension complicating pregnancy, unspecified trimester: Secondary | ICD-10-CM

## 2023-10-11 DIAGNOSIS — G4730 Sleep apnea, unspecified: Secondary | ICD-10-CM

## 2023-10-11 LAB — CBC
HCT: 33.4 % — ABNORMAL LOW (ref 36.0–46.0)
Hemoglobin: 11 g/dL — ABNORMAL LOW (ref 12.0–15.0)
MCH: 25.8 pg — ABNORMAL LOW (ref 26.0–34.0)
MCHC: 32.9 g/dL (ref 30.0–36.0)
MCV: 78.4 fL — ABNORMAL LOW (ref 80.0–100.0)
Platelets: 345 10*3/uL (ref 150–400)
RBC: 4.26 MIL/uL (ref 3.87–5.11)
RDW: 15 % (ref 11.5–15.5)
WBC: 6.5 10*3/uL (ref 4.0–10.5)
nRBC: 0 % (ref 0.0–0.2)

## 2023-10-11 LAB — WET PREP, GENITAL
Sperm: NONE SEEN
Trich, Wet Prep: NONE SEEN
WBC, Wet Prep HPF POC: <10 (ref <10)
Yeast Wet Prep HPF POC: NONE SEEN

## 2023-10-11 LAB — HCG, QUANTITATIVE, PREGNANCY: hCG, Beta Chain, Quant, S: 23 m[IU]/mL — ABNORMAL HIGH (ref <5)

## 2023-10-11 MED ORDER — METRONIDAZOLE 500 MG PO TABS: 500.0000 mg | ORAL_TABLET | Freq: Two times a day (BID) | ORAL | 0 refills | Status: DC

## 2023-10-11 MED ORDER — FOSFOMYCIN TROMETHAMINE 3 G PO PACK
3.0000 g | PACK | Freq: Once | ORAL | Status: AC
Start: 2023-10-11 — End: 2023-10-11
  Administered 2023-10-11: 3 g via ORAL
  Filled 2023-10-11: qty 3

## 2023-10-11 NOTE — Discharge Instructions (Addendum)
Take and finish antibiotic as prescribed.  Return to the ER for worsening symptoms, persistent vomiting, soaking more than 2 maxi pads per hour, or other concerns.

## 2023-10-12 ENCOUNTER — Encounter: Payer: Self-pay | Admitting: *Deleted

## 2023-11-10 ENCOUNTER — Ambulatory Visit: Payer: Medicaid Other

## 2024-02-28 ENCOUNTER — Other Ambulatory Visit: Payer: Self-pay

## 2024-02-28 DIAGNOSIS — L292 Pruritus vulvae: Secondary | ICD-10-CM | POA: Insufficient documentation

## 2024-02-28 DIAGNOSIS — Z5321 Procedure and treatment not carried out due to patient leaving prior to being seen by health care provider: Secondary | ICD-10-CM | POA: Insufficient documentation

## 2024-02-28 DIAGNOSIS — Z202 Contact with and (suspected) exposure to infections with a predominantly sexual mode of transmission: Secondary | ICD-10-CM | POA: Insufficient documentation

## 2024-02-28 LAB — WET PREP, GENITAL
Clue Cells Wet Prep HPF POC: NONE SEEN
Sperm: NONE SEEN
Trich, Wet Prep: NONE SEEN
WBC, Wet Prep HPF POC: <10 (ref <10)
Yeast Wet Prep HPF POC: NONE SEEN

## 2024-02-28 LAB — URINALYSIS, ROUTINE W REFLEX MICROSCOPIC
Bacteria, UA: NONE SEEN
Bilirubin Urine: NEGATIVE
Glucose, UA: NEGATIVE mg/dL
Hgb urine dipstick: NEGATIVE
Ketones, ur: NEGATIVE mg/dL
Nitrite: NEGATIVE
Protein, ur: NEGATIVE mg/dL
Specific Gravity, Urine: 1.013 (ref 1.005–1.030)
pH: 7 (ref 5.0–8.0)

## 2024-02-28 LAB — POC URINE PREG, ED: Preg Test, Ur: NEGATIVE

## 2024-02-28 LAB — CHLAMYDIA/NGC RT PCR (ARMC ONLY)
Chlamydia Tr: NOT DETECTED
N gonorrhoeae: NOT DETECTED

## 2024-02-28 NOTE — ED Triage Notes (Signed)
 Pt reports vaginal itching denies discharge, states she recently found out her partner cheated on her.

## 2024-02-29 ENCOUNTER — Emergency Department: Admission: EM | Admit: 2024-02-29 | Discharge: 2024-02-29 | Discharge status: Against Medical Advice

## 2024-06-05 NOTE — Progress Notes (Deleted)
 [facility] [address] [place], [place] [address]  Pulmonary Sleep Medicine   Office Visit Note  Patient Name: Vanessa Conner DOB: Feb 11, 1991 MRN [id number]    Chief Complaint: Obstructive Sleep Apnea visit  Brief History:  Vanessa Conner is seen today for an annual follow up on APAP @4 -20cmh20.  The patient has a 3 year  history of sleep apnea. Patient is not using PAP nightly.  The patient feels *** after sleeping with PAP.  The patient reports *** from PAP use. Reported sleepiness is  *** and the Epworth Sleepiness Score is *** out of 24. The patient *** take naps. The patient complains of the following: ***  The compliance download shows 16%  compliance with an average use time of 6  hours. The AHI is 0.1  The patient *** of limb movements disrupting sleep.  ROS  General: (-) fever, (-) chills, (-) night sweat Nose and Sinuses: (-) nasal stuffiness or itchiness, (-) postnasal drip, (-) nosebleeds, (-) sinus trouble. Mouth and Throat: (-) sore throat, (-) hoarseness. Neck: (-) swollen glands, (-) enlarged thyroid, (-) neck pain. Respiratory: *** cough, *** shortness of breath, *** wheezing. Neurologic: *** numbness, *** tingling. Psychiatric: *** anxiety, *** depression   Current Medication: Outpatient Encounter Medications as of 06/06/2024  Medication Sig   albuterol  (VENTOLIN  HFA) 108 (90 Base) MCG/ACT inhaler Inhale 2 puffs into the lungs every 4 (four) hours as needed. (Patient not taking: Reported on 09/13/2023)   aspirin  EC 81 MG tablet Take 2 tablets (162 mg total) by mouth at bedtime. Start taking when you are [redacted] weeks pregnant for rest of pregnancy for prevention of preeclampsia   Ferric Maltol  30 MG CAPS Take 1 capsule (30 mg total) by mouth 2 (two) times daily. Please take one hour before breakfast and dinner   labetalol  (NORMODYNE ) 200 MG tablet Take 2 tablets (400 mg total) by mouth 2 (two) times daily.   metroNIDAZOLE  (FLAGYL ) 500 MG tablet Take 1 tablet  (500 mg total) by mouth 2 (two) times daily.   Prenatal Vit-Fe Fumarate-FA (MULTIVITAMIN-PRENATAL) 27-0.8 MG TABS tablet Take 1 tablet by mouth daily at 12 noon.   No facility-administered encounter medications on file as of 06/06/2024.    Surgical History: Past Surgical History:  Procedure Laterality Date   LAPAROSCOPIC GASTRIC SLEEVE RESECTION  2022    Medical History: Past Medical History:  Diagnosis Date   Hypertension    Sickle cell trait    Sleep apnea     Family History: Non contributory to the present illness  Social History: Social History   Socioeconomic History   Marital status: Single    Spouse name: Not on file   Number of children: Not on file   Years of education: Not on file   Highest education level: Not on file  Occupational History   Not on file  Tobacco Use   Smoking status: Never   Smokeless tobacco: Never  Vaping Use   Vaping status: Never Used  Substance and Sexual Activity   Alcohol use: Not Currently   Drug use: No   Sexual activity: Yes    Birth control/protection: None  Other Topics Concern   Not on file  Social History Narrative   Not on file   Social Drivers of Health   Financial Resource Strain: Not on file  Food Insecurity: Not on file  Transportation Needs: Not on file  Physical Activity: Not on file  Stress: Not on file  Social Connections: Unknown (01/18/2022)  Received from [facility]   Social Network    Social Network: Not on file  Intimate Partner Violence: Unknown (12/10/2021)   Received from [facility]   HITS    Physically Hurt: Not on file    Insult or Talk Down To: Not on file    Threaten Physical Harm: Not on file    Scream or Curse: Not on file    Vital Signs: unknown if currently breastfeeding. There is no height or weight on file to calculate BMI.    Examination: General Appearance: The patient is well-developed, well-nourished, and in no distress. Neck Circumference: *** Skin: Gross  inspection of skin unremarkable. Head: normocephalic, no gross deformities. Eyes: no gross deformities noted. ENT: ears appear grossly normal Neurologic: Alert and oriented. No involuntary movements.  STOP BANG RISK ASSESSMENT S (snore) Have you been told that you snore?     YES/N   T (tired) Are you often tired, fatigued, or sleepy during the day?   YES/NO  O (obstruction) Do you stop breathing, choke, or gasp during sleep? YES/NO   P (pressure) Do you have or are you being treated for high blood pressure? YES/NO   B (BMI) Is your body index greater than 35 kg/m? YES/NO   A (age) Are you 31 years old or older? NO   N (neck) Do you have a neck circumference greater than 16 inches?   YES/NO   G (gender) Are you a female? NO   TOTAL STOP/BANG "YES" ANSWERS        A STOP-Bang score of 2 or less is considered low risk, and a score of 5 or more is high risk for having either moderate or severe OSA. For people who score 3 or 4, doctors may need to perform further assessment to determine how likely they are to have OSA.         EPWORTH SLEEPINESS SCALE:  Scale:  (0)= no chance of dozing; (1)= slight chance of dozing; (2)= moderate chance of dozing; (3)= high chance of dozing  Chance  Situtation    Sitting and reading: ***    Watching TV: ***    Sitting Inactive in public: ***    As a passenger in car: ***      Lying down to rest: ***    Sitting and talking: ***    Sitting quielty after lunch: ***    In a car, stopped in traffic: ***   TOTAL SCORE:   *** out of 24    SLEEP STUDIES:  PSG (01/2021) AHI 20/hr, min Sp02 85% Titration (02/2021) APAP @ 9-15 cmh2O   CPAP COMPLIANCE DATA:  Date Range: 06/02/2023-05/31/2024  Average Daily Use: 6 hours 17 min  Median Use: 6 hrs 41 min  Compliance for > 4 Hours: 16% days  AHI: 0.1 respiratory events per hour  Days Used: 65/365  Mask Leak: 10.2  95th Percentile Pressure: 7.2 cmh20         LABS: No  results found for this or any previous visit (from the past 2160 hours).  Radiology: No results found.  No results found.  No results found.    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 The patient was reminded how to *** and advised to ***. The patient was also counselled on ***. The compliance is ***. The AHI is ***.   ***  General Counseling: I have discussed the findings of the evaluation and examination with Vanessa Conner.  I have also discussed any further diagnostic evaluation thatmay be needed or ordered today. Vanessa Conner verbalizes understanding of the findings of todays visit. We also reviewed her medications today and discussed drug interactions and side effects including but not limited excessive drowsiness and altered mental states. We also discussed that there is always a risk not just to her but also people around her. she has been encouraged to call the office with any questions or concerns that should arise related to todays visit.  No orders of the defined types were placed in this encounter.       I have personally obtained a history, examined the patient, evaluated laboratory and imaging results, formulated the assessment and plan and placed orders.  Vanessa DELENA Bathe, MD [facility] Diplomate ABMS Pulmonary Critical Care Medicine and Sleep Medicine

## 2024-06-06 ENCOUNTER — Ambulatory Visit

## 2024-09-18 ENCOUNTER — Ambulatory Visit: Admitting: *Deleted

## 2024-09-18 ENCOUNTER — Other Ambulatory Visit (INDEPENDENT_AMBULATORY_CARE_PROVIDER_SITE_OTHER): Payer: Self-pay

## 2024-09-18 VITALS — BP 155/87 | HR 73 | Wt 339.0 lb

## 2024-09-18 DIAGNOSIS — Z3A08 8 weeks gestation of pregnancy: Secondary | ICD-10-CM

## 2024-09-18 DIAGNOSIS — O30041 Twin pregnancy, dichorionic/diamniotic, first trimester: Secondary | ICD-10-CM | POA: Insufficient documentation

## 2024-09-18 DIAGNOSIS — O0991 Supervision of high risk pregnancy, unspecified, first trimester: Secondary | ICD-10-CM

## 2024-09-18 DIAGNOSIS — O3680X0 Pregnancy with inconclusive fetal viability, not applicable or unspecified: Secondary | ICD-10-CM

## 2024-09-18 DIAGNOSIS — O09529 Supervision of elderly multigravida, unspecified trimester: Secondary | ICD-10-CM | POA: Insufficient documentation

## 2024-09-18 DIAGNOSIS — O0990 Supervision of high risk pregnancy, unspecified, unspecified trimester: Secondary | ICD-10-CM | POA: Insufficient documentation

## 2024-09-18 NOTE — Progress Notes (Signed)
 New OB Intake  I explained I am completing New OB Intake today. We discussed EDD of 04/30/2025, by Last Menstrual Period. Pt is [id number]. I reviewed her allergies, medications and Medical/Surgical/OB history.    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  Patient also informed that the ultrasound is not being completed with the intent of assessing for fetal or placental anomalies or any pelvic abnormalities. Explained that the purpose of today's ultrasound is to assess for viability.  Patient acknowledges the purpose of the exam and the limitations of the study.     Delivery Plans Plans to deliver at [facility]. Discussed the nature of our practice with multiple providers including residents and students. Due to the size of the practice, the delivering provider may not be the same as those providing prenatal care.   MyChart/Babyscripts MyChart access verified. I explained pt will have some visits in office and some virtually. Babyscripts app discussed and ordered.   Blood Pressure Cuff Blood pressure cuff discussed and pt has one from previous pregnancyDiscussed to be used for virtual visits and or if needed BP checks weekly.  Anatomy US  Explained first scheduled US  will be around 19 weeks.   Last Pap Needs pap  First visit review I reviewed new OB appt with patient. Explained pt will be seen by Dr Izell at first visit.  Discussed [place] genetic screening with patient will get panorama. Routine prenatal labs to be collected at [facility].  Discussed with Dr Herchel about pt's BP today, to increase to 400mg  BID. Pt made aware.   Wanda Buckles, RN 09/18/2024  4:21 PM

## 2024-09-25 ENCOUNTER — Encounter: Admitting: Obstetrics and Gynecology

## 2024-10-03 ENCOUNTER — Other Ambulatory Visit: Payer: Self-pay

## 2024-10-03 ENCOUNTER — Encounter: Payer: Self-pay | Admitting: Obstetrics and Gynecology

## 2024-10-03 ENCOUNTER — Other Ambulatory Visit (HOSPITAL_COMMUNITY)
Admission: RE | Admit: 2024-10-03 | Discharge: 2024-10-03 | Disposition: A | Source: Ambulatory Visit | Attending: Obstetrics and Gynecology | Admitting: Obstetrics and Gynecology

## 2024-10-03 ENCOUNTER — Ambulatory Visit (INDEPENDENT_AMBULATORY_CARE_PROVIDER_SITE_OTHER): Admitting: Obstetrics and Gynecology

## 2024-10-03 VITALS — BP 108/77 | HR 83 | Wt 334.0 lb

## 2024-10-03 DIAGNOSIS — Z3A10 10 weeks gestation of pregnancy: Secondary | ICD-10-CM

## 2024-10-03 DIAGNOSIS — O0990 Supervision of high risk pregnancy, unspecified, unspecified trimester: Secondary | ICD-10-CM | POA: Insufficient documentation

## 2024-10-03 DIAGNOSIS — Z6841 Body Mass Index (BMI) 40.0 and over, adult: Secondary | ICD-10-CM

## 2024-10-03 DIAGNOSIS — G4730 Sleep apnea, unspecified: Secondary | ICD-10-CM

## 2024-10-03 DIAGNOSIS — O30041 Twin pregnancy, dichorionic/diamniotic, first trimester: Secondary | ICD-10-CM

## 2024-10-03 DIAGNOSIS — O99211 Obesity complicating pregnancy, first trimester: Secondary | ICD-10-CM | POA: Diagnosis not present

## 2024-10-03 DIAGNOSIS — O10911 Unspecified pre-existing hypertension complicating pregnancy, first trimester: Secondary | ICD-10-CM | POA: Diagnosis not present

## 2024-10-03 DIAGNOSIS — D573 Sickle-cell trait: Secondary | ICD-10-CM

## 2024-10-03 DIAGNOSIS — O0991 Supervision of high risk pregnancy, unspecified, first trimester: Secondary | ICD-10-CM

## 2024-10-03 DIAGNOSIS — Z3481 Encounter for supervision of other normal pregnancy, first trimester: Secondary | ICD-10-CM

## 2024-10-03 DIAGNOSIS — Z903 Acquired absence of stomach [part of]: Secondary | ICD-10-CM

## 2024-10-03 DIAGNOSIS — O09521 Supervision of elderly multigravida, first trimester: Secondary | ICD-10-CM | POA: Diagnosis not present

## 2024-10-03 DIAGNOSIS — O10919 Unspecified pre-existing hypertension complicating pregnancy, unspecified trimester: Secondary | ICD-10-CM

## 2024-10-03 DIAGNOSIS — Z131 Encounter for screening for diabetes mellitus: Secondary | ICD-10-CM

## 2024-10-03 DIAGNOSIS — Z31430 Encounter of female for testing for genetic disease carrier status for procreative management: Secondary | ICD-10-CM

## 2024-10-04 LAB — COMPREHENSIVE METABOLIC PANEL WITH GFR
ALT: 9 [IU]/L (ref 0–32)
AST: 17 [IU]/L (ref 0–40)
Albumin: 3.6 g/dL — ABNORMAL LOW (ref 3.9–4.9)
Alkaline Phosphatase: 77 [IU]/L (ref 41–116)
BUN/Creatinine Ratio: 13 (ref 9–23)
BUN: 8 mg/dL (ref 6–20)
Bilirubin Total: 0.2 mg/dL (ref 0.0–1.2)
CO2: 19 mmol/L — ABNORMAL LOW (ref 20–29)
Calcium: 9 mg/dL (ref 8.7–10.2)
Chloride: 106 mmol/L (ref 96–106)
Creatinine, Ser: 0.62 mg/dL (ref 0.57–1.00)
Globulin, Total: 2.8 g/dL (ref 1.5–4.5)
Glucose: 89 mg/dL (ref 70–99)
Potassium: 4.4 mmol/L (ref 3.5–5.2)
Sodium: 136 mmol/L (ref 134–144)
Total Protein: 6.4 g/dL (ref 6.0–8.5)
eGFR: 119 mL/min/{1.73_m2}

## 2024-10-04 LAB — CBC/D/PLT+RPR+RH+ABO+RUBIGG...
Antibody Screen: NEGATIVE
Basophils Absolute: 0 10*3/uL (ref 0.0–0.2)
Basos: 1 %
EOS (ABSOLUTE): 0.1 10*3/uL (ref 0.0–0.4)
Eos: 1 %
HCV Ab: NONREACTIVE
HIV Screen 4th Generation wRfx: NONREACTIVE
Hematocrit: 33.5 % — ABNORMAL LOW (ref 34.0–46.6)
Hemoglobin: 10.4 g/dL — ABNORMAL LOW (ref 11.1–15.9)
Hepatitis B Surface Ag: NEGATIVE
Immature Grans (Abs): 0 10*3/uL (ref 0.0–0.1)
Immature Granulocytes: 0 %
Lymphocytes Absolute: 1.8 10*3/uL (ref 0.7–3.1)
Lymphs: 23 %
MCH: 25.1 pg — ABNORMAL LOW (ref 26.6–33.0)
MCHC: 31 g/dL — ABNORMAL LOW (ref 31.5–35.7)
MCV: 81 fL (ref 79–97)
Monocytes Absolute: 0.8 10*3/uL (ref 0.1–0.9)
Monocytes: 10 %
Neutrophils Absolute: 4.9 10*3/uL (ref 1.4–7.0)
Neutrophils: 65 %
Platelets: 322 10*3/uL (ref 150–450)
RBC: 4.14 x10E6/uL (ref 3.77–5.28)
RDW: 17.3 % — ABNORMAL HIGH (ref 11.7–15.4)
RPR Ser Ql: NONREACTIVE
Rh Factor: POSITIVE
Rubella Antibodies, IGG: 1.26 {index}
WBC: 7.6 10*3/uL (ref 3.4–10.8)

## 2024-10-04 LAB — ANEMIA PROFILE B
Ferritin: 24 ng/mL (ref 15–150)
Folate: 12.2 ng/mL
Iron Saturation: 19 % (ref 15–55)
Iron: 77 ug/dL (ref 27–159)
Retic Ct Pct: 1.8 % (ref 0.6–2.6)
Total Iron Binding Capacity: 399 ug/dL (ref 250–450)
UIBC: 322 ug/dL (ref 131–425)
Vitamin B-12: 441 pg/mL (ref 232–1245)

## 2024-10-04 LAB — TSH RFX ON ABNORMAL TO FREE T4: TSH: 0.179 u[IU]/mL — ABNORMAL LOW (ref 0.450–4.500)

## 2024-10-04 LAB — HCV INTERPRETATION

## 2024-10-04 LAB — CULTURE, OB URINE

## 2024-10-04 LAB — PROTEIN / CREATININE RATIO, URINE
Creatinine, Urine: 111.6 mg/dL
Protein, Ur: 13 mg/dL
Protein/Creat Ratio: 116 mg/g{creat} (ref 0–200)

## 2024-10-04 LAB — URINE CULTURE, OB REFLEX

## 2024-10-04 LAB — HEMOGLOBIN A1C
Est. average glucose Bld gHb Est-mCnc: 100 mg/dL
Hgb A1c MFr Bld: 5.1 % (ref 4.8–5.6)

## 2024-10-04 LAB — T4F: T4,Free (Direct): 1.38 ng/dL (ref 0.82–1.77)

## 2024-10-04 NOTE — Progress Notes (Signed)
 New OB Note  10/03/2024   Clinic: [facility]  Chief Complaint: New OB  Transfer of Care Patient: no  History of Present Illness: [name] is a 34 y.o. 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   No SAB s/s.   ROS: A 12-point review of systems was performed and negative, except as stated in the above HPI.  OBGYN History: As per HPI. 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  Social History:  Social History   Socioeconomic History   Marital status: Single    Spouse name: Not on file   Number of children: Not on file   Years of education: Not on file   Highest education level: Not on file  Occupational History   Not on file  Tobacco Use   Smoking status: Never   Smokeless tobacco: Never  Vaping Use   Vaping status: Never Used  Substance and Sexual  Activity   Alcohol use: Not Currently   Drug use: No   Sexual activity: Yes    Birth control/protection: None  Other Topics Concern   Not on file  Social History Narrative   Not on file   Social Drivers of Health   Tobacco Use: Low Risk (09/18/2024)   Patient History    Smoking Tobacco Use: Never    Smokeless Tobacco Use: Never    Passive Exposure: Not on file  Financial Resource Strain: Not on file  Food Insecurity: Not on file  Transportation Needs: Not on file  Physical Activity: Not on file  Stress: Not on file  Social Connections: Unknown (01/18/2022)   Received from [facility]   Social Network    Social Network: Not on file  Intimate Partner Violence: Unknown (12/10/2021)   Received from [facility]   HITS    Physically Hurt: Not on file    Insult or Talk Down To: Not on file    Threaten Physical Harm: Not on file    Scream or Curse: Not on file  Depression (EYV7-0): Not on file  Alcohol Screen: Not on file  Housing: Not on file  Utilities: Not on file  Health Literacy: Not on file   Allergy: Allergies[1]  Current  Outpatient Medications: Prenatal vitamin. ASA 81 qday. Labetalol  200 bid  Physical Exam:   BP 108/77   Pulse 83   Wt (!) 334 lb (151.5 kg)   LMP 07/24/2024 Comment: Miscarried 09/18/2023  BMI 52.31 kg/m  Body mass index is 52.31 kg/m. Contractions: Not present    General appearance: Well nourished, well developed female in no acute distress.  Cardiovascular: S1, S2 normal, no murmur, rub or gallop, regular rate and rhythm Respiratory:  Clear to auscultation bilateral. Normal respiratory effort Abdomen: positive bowel sounds and no masses, hernias; diffusely non tender to palpation, non distended Breasts: no s/s Neuro/Psych:  Normal mood and affect.  Skin:  Warm and dry.   Pelvic exam: declined  Laboratory: none  Imaging:  Bedside u/s with twin IUP c/w di-di and normal FHR x 2 and c/w GA  Assessment: patient doing  well  Plan: 1. Supervision of high risk pregnancy, antepartum (Primary) Offer afp at 15 weeks D/w her to stop ASA  Declines pap today.  - US  OB Limited; Future - CBC/D/Plt+RPR+Rh+ABO+RubIgG... - Hemoglobin A1c - Culture, OB Urine - TSH Rfx on Abnormal to Free T4 - Comprehensive metabolic panel with GFR - Protein / creatinine ratio, urine - Anemia Profile B - Cervicovaginal ancillary only( [place])  2. Twin pregnancy, dichorionic/diamniotic, first trimester - US  OB Limited; Future  3. BMI 50.0-59.9, adult [facility]) Nutrition consult offered. Recommend 20-25lbs total weight gain this pregnancy  4. H/O gastric sleeve Follow up b12 and folic acid levels. Hold off on ASA and NSAIDs  5. [redacted] weeks gestation of pregnancy  6. Severe obesity due to excess calories affecting pregnancy in first trimester (HCC)  7. Encounter for supervision of other normal pregnancy in first trimester - PANORAMA PRENATAL TEST  8. Encounter of female for testing for genetic disease carrier status for procreative management - HORIZON Basic Panel  9. Encounter for supervision of other normal pregnancy, first trimester - HORIZON Basic Panel  10. Screening for diabetes mellitus - Hemoglobin A1c  11. AMA (advanced maternal age) multigravida 35+, first trimester - PANORAMA PRENATAL TEST - TSH Rfx on Abnormal to Free T4 - Comprehensive metabolic panel with GFR - Protein / creatinine ratio, urine  12. Sickle cell trait - Anemia Profile B  13. Multigravida of advanced maternal age in first trimester  39. Chronic hypertension during pregnancy, antepartum Continue labetalol   15. Sleep apnea, unspecified type Pt confirms on cpap. Importance of continuing this confirmed with her.   Problem list reviewed and updated.  Follow up in 3 weeks.  The nature of [facility] with multiple MDs and other Advanced Practice Providers was explained to patient; also emphasized  that residents, students are part of our team.  >50% of 35 min visit spent on counseling and coordination of care.  Return in about 3 weeks (around 10/24/2024) for in person, md or app, high risk ob. Future Appointments  Date Time Provider Department Center  10/23/2024  2:50 PM Herchel Gloris LABOR, MD [facility]  12/04/2024  9:00 AM [facility] PROVIDER 1 [facility]  12/04/2024  9:30 AM [facility] US2 [facility]   Bebe Izell Overcast MD Attending [facility] (Faculty Practice)     [1]  Allergies Allergen Reactions   Amoxicillin  Other (See Comments)    Yeast Infection

## 2024-10-05 LAB — CERVICOVAGINAL ANCILLARY ONLY
Chlamydia: NEGATIVE
Comment: NEGATIVE
Comment: NEGATIVE
Comment: NORMAL
Neisseria Gonorrhea: NEGATIVE
Trichomonas: NEGATIVE

## 2024-10-10 ENCOUNTER — Encounter: Payer: Self-pay | Admitting: *Deleted

## 2024-10-10 ENCOUNTER — Other Ambulatory Visit: Payer: Self-pay

## 2024-10-10 DIAGNOSIS — O26891 Other specified pregnancy related conditions, first trimester: Secondary | ICD-10-CM | POA: Insufficient documentation

## 2024-10-10 DIAGNOSIS — R102 Pelvic and perineal pain unspecified side: Secondary | ICD-10-CM | POA: Insufficient documentation

## 2024-10-10 DIAGNOSIS — N760 Acute vaginitis: Secondary | ICD-10-CM | POA: Insufficient documentation

## 2024-10-10 DIAGNOSIS — Z3A11 11 weeks gestation of pregnancy: Secondary | ICD-10-CM | POA: Insufficient documentation

## 2024-10-10 DIAGNOSIS — B9689 Other specified bacterial agents as the cause of diseases classified elsewhere: Secondary | ICD-10-CM | POA: Insufficient documentation

## 2024-10-10 DIAGNOSIS — I10 Essential (primary) hypertension: Secondary | ICD-10-CM | POA: Insufficient documentation

## 2024-10-10 DIAGNOSIS — O99011 Anemia complicating pregnancy, first trimester: Secondary | ICD-10-CM | POA: Insufficient documentation

## 2024-10-10 DIAGNOSIS — N390 Urinary tract infection, site not specified: Secondary | ICD-10-CM | POA: Insufficient documentation

## 2024-10-10 LAB — URINALYSIS, ROUTINE W REFLEX MICROSCOPIC
Bilirubin Urine: NEGATIVE
Glucose, UA: NEGATIVE mg/dL
Hgb urine dipstick: NEGATIVE
Ketones, ur: NEGATIVE mg/dL
Nitrite: NEGATIVE
Protein, ur: NEGATIVE mg/dL
Specific Gravity, Urine: 1.013 (ref 1.005–1.030)
pH: 6 (ref 5.0–8.0)

## 2024-10-10 LAB — CBC
HCT: 33.3 % — ABNORMAL LOW (ref 36.0–46.0)
Hemoglobin: 10.3 g/dL — ABNORMAL LOW (ref 12.0–15.0)
MCH: 24.8 pg — ABNORMAL LOW (ref 26.0–34.0)
MCHC: 30.9 g/dL (ref 30.0–36.0)
MCV: 80.2 fL (ref 80.0–100.0)
Platelets: 267 10*3/uL (ref 150–400)
RBC: 4.15 MIL/uL (ref 3.87–5.11)
RDW: 18.1 % — ABNORMAL HIGH (ref 11.5–15.5)
WBC: 8.8 10*3/uL (ref 4.0–10.5)
nRBC: 0 % (ref 0.0–0.2)

## 2024-10-10 LAB — COMPREHENSIVE METABOLIC PANEL WITH GFR
ALT: 12 U/L (ref 0–44)
AST: 19 U/L (ref 15–41)
Albumin: 3.4 g/dL — ABNORMAL LOW (ref 3.5–5.0)
Alkaline Phosphatase: 72 U/L (ref 38–126)
Anion gap: 14 (ref 5–15)
BUN: 10 mg/dL (ref 6–20)
CO2: 16 mmol/L — ABNORMAL LOW (ref 22–32)
Calcium: 8.9 mg/dL (ref 8.9–10.3)
Chloride: 107 mmol/L (ref 98–111)
Creatinine, Ser: 0.67 mg/dL (ref 0.44–1.00)
GFR, Estimated: >60 mL/min
Glucose, Bld: 91 mg/dL (ref 70–99)
Potassium: 4.3 mmol/L (ref 3.5–5.1)
Sodium: 137 mmol/L (ref 135–145)
Total Bilirubin: 0.2 mg/dL (ref 0.0–1.2)
Total Protein: 6.5 g/dL (ref 6.5–8.1)

## 2024-10-10 LAB — LIPASE, BLOOD: Lipase: 21 U/L (ref 11–51)

## 2024-10-10 LAB — HCG, QUANTITATIVE, PREGNANCY: hCG, Beta Chain, Quant, S: 94683 m[IU]/mL — ABNORMAL HIGH

## 2024-10-10 LAB — POC URINE PREG, ED: Preg Test, Ur: POSITIVE — AB

## 2024-10-10 NOTE — ED Triage Notes (Signed)
 Pt ambulatory to triage.  Pt is approx [redacted] weeks pregnant.  Pt has low abd pain.  No vag bleeding.  No urinary sx.  No back pain.  Pt alert  speech clear.

## 2024-10-11 ENCOUNTER — Emergency Department

## 2024-10-11 ENCOUNTER — Emergency Department
Admission: EM | Admit: 2024-10-11 | Discharge: 2024-10-11 | Disposition: A | Attending: Emergency Medicine | Admitting: Emergency Medicine

## 2024-10-11 ENCOUNTER — Other Ambulatory Visit

## 2024-10-11 DIAGNOSIS — R102 Pelvic and perineal pain unspecified side: Secondary | ICD-10-CM

## 2024-10-11 DIAGNOSIS — N390 Urinary tract infection, site not specified: Secondary | ICD-10-CM

## 2024-10-11 DIAGNOSIS — B9689 Other specified bacterial agents as the cause of diseases classified elsewhere: Secondary | ICD-10-CM

## 2024-10-11 LAB — WET PREP, GENITAL
Sperm: NONE SEEN
Trich, Wet Prep: NONE SEEN
WBC, Wet Prep HPF POC: >=10 — AB
Yeast Wet Prep HPF POC: NONE SEEN

## 2024-10-11 LAB — CHLAMYDIA/NGC RT PCR (ARMC ONLY)
Chlamydia Tr: NOT DETECTED
N gonorrhoeae: NOT DETECTED

## 2024-10-11 MED ORDER — ACETAMINOPHEN 500 MG PO TABS
1000.0000 mg | ORAL_TABLET | Freq: Once | ORAL | Status: AC
  Administered 2024-10-11: 1000 mg via ORAL
  Filled 2024-10-11: qty 2

## 2024-10-11 MED ORDER — METRONIDAZOLE 500 MG PO TABS
500.0000 mg | ORAL_TABLET | Freq: Once | ORAL | Status: AC
  Administered 2024-10-11: 500 mg via ORAL
  Filled 2024-10-11: qty 1

## 2024-10-11 MED ORDER — METRONIDAZOLE 500 MG PO TABS: 500.0000 mg | ORAL_TABLET | Freq: Two times a day (BID) | ORAL | 0 refills | Status: AC

## 2024-10-11 NOTE — ED Notes (Signed)
Patient discharged at this time. Ambulated to lobby with independent and steady gait. Breathing unlabored speaking in full sentences. Verbalized understanding of all discharge, follow up, and medication teaching. Discharged homed with all belongings.   

## 2024-10-11 NOTE — ED Provider Notes (Signed)
 "  [facility] Provider Note    Event Date/Time   First MD Initiated Contact with Patient 10/11/24 0002     (approximate)   History   Abdominal Pain   HPI  Vanessa Conner is a 34 y.o. female G3P2 approximate 11 weeks and 2 days pregnant with LMP of 07/24/2024 who presents to the emergency department complaints of pelvic pain.  Has had confirmatory ultrasound showing intrauterine twin pregnancy.  Followed by OB/GYN in [place].  Denies dysuria, hematuria, vaginal bleeding or discharge but has had some odor.  No itching, burning.  No fevers, vomiting or diarrhea.   History provided by patient.    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 Authorizing Provider  albuterol  (VENTOLIN  HFA) 108 (90 Base) MCG/ACT inhaler Inhale 2 puffs into the lungs every 4 (four) hours as needed. Patient not taking: Reported on 09/13/2023 09/01/23   Jacolyn Pae, MD  labetalol  (NORMODYNE ) 200 MG tablet Take 2 tablets (400 mg total) by mouth 2 (two) times daily. 09/13/23   Anyanwu, Ugonna A, MD  Prenatal Vit-Fe Fumarate-FA (MULTIVITAMIN-PRENATAL) 27-0.8 MG TABS tablet Take 1 tablet by mouth daily at 12 noon.    [provider]    Physical Exam   Triage Vital Signs: ED Triage Vitals  Encounter Vitals Group     BP 10/10/24 1954 (!) 143/88     Girls Systolic BP Percentile --      Girls Diastolic BP Percentile --      Boys Systolic BP Percentile --      Boys Diastolic BP Percentile --      Pulse Rate 10/10/24 1954 94     Resp 10/10/24 1954 20     Temp 10/10/24 1954 99.3 F (37.4 C)     Temp Source 10/10/24 1954 Oral     SpO2 10/10/24  1954 95 %     Weight 10/10/24 1951 (!) 330 lb (149.7 kg)     Height 10/10/24 1951 5' 5 (1.651 m)     Head Circumference --      Peak Flow --      Pain Score 10/10/24 1950 6     Pain Loc --      Pain Education --      Exclude from Growth Chart --     Most recent vital signs: Vitals:   10/10/24 1954  BP: (!) 143/88  Pulse: 94  Resp: 20  Temp: 99.3 F (37.4 C)  SpO2: 95%    CONSTITUTIONAL: Alert, responds appropriately to questions. Well-appearing; well-nourished HEAD: Normocephalic, atraumatic EYES: Conjunctivae clear, pupils appear equal, sclera nonicteric ENT: normal nose; moist mucous membranes NECK: Supple, normal ROM CARD: RRR; S1 and S2 appreciated RESP: Normal chest excursion without splinting or tachypnea; breath sounds clear and equal bilaterally; no wheezes, no rhonchi, no rales, no hypoxia or respiratory distress, speaking full sentences ABD/GI: Non-distended; soft, non-tender, no rebound, no guarding, no peritoneal signs, no tenderness at McBurney's point BACK: The back appears normal EXT: Normal ROM in all joints; no deformity noted, no edema SKIN: Normal color for age and race; warm; no rash on exposed skin NEURO: Moves all  extremities equally, normal speech PSYCH: The patient's mood and manner are appropriate.   ED Results / Procedures / Treatments   LABS: (all labs ordered are listed, but only abnormal results are displayed) Labs Reviewed  WET PREP, GENITAL - Abnormal; Notable for the following components:      Result Value   Clue Cells Wet Prep HPF POC PRESENT (*)    WBC, Wet Prep HPF POC >=10 (*)    All other components within normal limits  COMPREHENSIVE METABOLIC PANEL WITH GFR - Abnormal; Notable for the following components:   CO2 16 (*)    Albumin 3.4 (*)    All other components within normal limits  CBC - Abnormal; Notable for the following components:   Hemoglobin 10.3 (*)    HCT 33.3 (*)    MCH 24.8 (*)    RDW 18.1 (*)    All other  components within normal limits  URINALYSIS, ROUTINE W REFLEX MICROSCOPIC - Abnormal; Notable for the following components:   Color, Urine YELLOW (*)    APPearance HAZY (*)    Leukocytes,Ua MODERATE (*)    Bacteria, UA RARE (*)    All other components within normal limits  HCG, QUANTITATIVE, PREGNANCY - Abnormal; Notable for the following components:   hCG, Beta Chain, Quant, S 05,316 (*)    All other components within normal limits  POC URINE PREG, ED - Abnormal; Notable for the following components:   Preg Test, Ur POSITIVE (*)    All other components within normal limits  CHLAMYDIA/NGC RT PCR ([facility] ONLY)            LIPASE, BLOOD     EKG:  EKG Interpretation Date/Time:    Ventricular Rate:    PR Interval:    QRS Duration:    QT Interval:    QTC Calculation:   R Axis:      Text Interpretation:           RADIOLOGY: My personal review and interpretation of imaging: Ultrasound shows normal blood flow to both ovaries.  Normal intrauterine twin pregnancy with normal fetal heart rates.  I have personally reviewed all radiology reports.   US  PELVIC DOPPLER (TORSION R/O OR MASS ARTERIAL FLOW) Result Date: 10/11/2024 EXAM: OBSTETRIC ULTRASOUND FIRST TRIMESTER, TWIN TECHNIQUE: Transabdominal twin first trimester obstetric pelvic duplex ultrasound was performed with real-time imaging, color flow Doppler imaging, and spectral analysis. COMPARISON: None available. CLINICAL HISTORY: Pelvic pain. FINDINGS: UTERUS: No focal myometrial mass. No subchorionic hemorrhage. GESTATIONAL SAC(S): An intrauterine gestation identified with thick intertwin membrae, likely dichorionic diamnionic. No subchorionic hemorrhage. YOLK SAC: No yolk sac identified for either twin. EMBRYO(<11WK) /FETUS(>=11WK): Twin A: Heart rate 171 beats per minute. Crown-rump length 46 mm corresponding to a gestational age of [redacted] weeks and 3 days with estimated date of delivery 04/29/2025. Twin B: Heart rate 171 beats per  minute. Crown-rump length 4.46 mm corresponding to a gestational age of [redacted] weeks and 3 days with estimated date of delivery 04/29/2025. RIGHT OVARY: Appears within normal limits. Normal arterial and venous flow. LEFT OVARY: Appears within normal limits. Normal arterial and venous flow. FREE FLUID: No pelvic free fluid. MEASUREMENTS ESTIMATED GESTATIONAL AGE BY CURRENT ULTRASOUND: 11 weeks and 3 days (for both Twin A and Twin B) ESTIMATED GESTATIONAL AGE BY LMP/PRIOR ULTRASOUND: Not provided. ESTIMATED DUE DATE: 04/29/2025 (for both Twin A and Twin B) IMPRESSION: 1. Intrauterine twin gestation, likely dichorionic diamniotic, with estimated gestational age of [redacted] weeks 3 days and fetal heart rates  of 171 beats per minute. Electronically signed by: Greig Pique MD 10/11/2024 01:26 AM EST RP Workstation: [id number]   US  OB Comp AddL Gest Less 14 Wks Result Date: 10/11/2024 EXAM: OBSTETRIC ULTRASOUND FIRST TRIMESTER, TWIN TECHNIQUE: Transabdominal twin first trimester obstetric pelvic duplex ultrasound was performed with real-time imaging, color flow Doppler imaging, and spectral analysis. COMPARISON: None available. CLINICAL HISTORY: Pelvic pain. FINDINGS: UTERUS: No focal myometrial mass. No subchorionic hemorrhage. GESTATIONAL SAC(S): An intrauterine gestation identified with thick intertwin membrae, likely dichorionic diamnionic. No subchorionic hemorrhage. YOLK SAC: No yolk sac identified for either twin. EMBRYO(<11WK) /FETUS(>=11WK): Twin A: Heart rate 171 beats per minute. Crown-rump length 46 mm corresponding to a gestational age of [redacted] weeks and 3 days with estimated date of delivery 04/29/2025. Twin B: Heart rate 171 beats per minute. Crown-rump length 4.46 mm corresponding to a gestational age of [redacted] weeks and 3 days with estimated date of delivery 04/29/2025. RIGHT OVARY: Appears within normal limits. Normal arterial and venous flow. LEFT OVARY: Appears within normal limits. Normal arterial and venous flow.  FREE FLUID: No pelvic free fluid. MEASUREMENTS ESTIMATED GESTATIONAL AGE BY CURRENT ULTRASOUND: 11 weeks and 3 days (for both Twin A and Twin B) ESTIMATED GESTATIONAL AGE BY LMP/PRIOR ULTRASOUND: Not provided. ESTIMATED DUE DATE: 04/29/2025 (for both Twin A and Twin B) IMPRESSION: 1. Intrauterine twin gestation, likely dichorionic diamniotic, with estimated gestational age of [redacted] weeks 3 days and fetal heart rates of 171 beats per minute. Electronically signed by: Greig Pique MD 10/11/2024 01:26 AM EST RP Workstation: [id number]   US  OB Comp Less 14 Wks Result Date: 10/11/2024 EXAM: OBSTETRIC ULTRASOUND FIRST TRIMESTER TECHNIQUE: Transabdominal first trimester obstetric pelvic duplex ultrasound was performed with real-time imaging, color flow Doppler imaging, and spectral analysis. COMPARISON: Comparison with 10/03/2024. CLINICAL HISTORY: Pain. FINDINGS: UTERUS: No focal myometrial mass. GESTATIONAL SAC(S): Dichorionic diamniotic twin gestation. No subchorionic hemorrhage. YOLK SAC: No yolk sac is visualized. EMBRYO(<11WK) /FETUS(>=11WK): 2 embryos with cardiac activity are visualized. CROWN RUMP LENGTH: Fetus 1: 46 mm corresponding to an 11-week 3-day gestational age and an EDC of 04/29/2025. Fetus 2: 46 mm corresponding to an 11-week 3-day gestational age and an EDC of 04/29/2025. RATE OF CARDIAC ACTIVITY: Fetus 1: 171 bpm. Fetus 2: 171 bpm. RIGHT OVARY: Unremarkable. Normal arterial and venous flow. LEFT OVARY: Unremarkable. Normal arterial and venous flow. FREE FLUID: No free fluid. MEASUREMENTS ESTIMATED GESTATIONAL AGE BY CURRENT ULTRASOUND: Fetus 1: 11 weeks 3 days. Fetus 2: 11 weeks 3 days. ESTIMATED GESTATIONAL AGE BY LMP/PRIOR ULTRASOUND: Not provided. ESTIMATED DUE DATE: Fetus 1: 04/29/2025. Fetus 2: 04/29/2025. IMPRESSION: 1. Dichorionic diamniotic twin gestation with cardiac activity in both embryos. 2. Normal ovaries with arterial and venous flow bilaterally. Electronically signed by: Norman Gatlin  MD 10/11/2024 01:23 AM EST RP Workstation: [id number]     PROCEDURES:  Critical Care performed: No      Procedures    IMPRESSION / MDM / ASSESSMENT AND PLAN / ED COURSE  I reviewed the triage vital signs and the nursing notes.    Patient here with pelvic pain in the setting of pregnancy.    DIFFERENTIAL DIAGNOSIS (includes but not limited to):   Round ligament pain, UTI, miscarriage, TOA, torsion, ovarian cyst, doubt ectopic given that she has had outpatient confirmatory ultrasound of IUP, low suspicion clinically for appendicitis as no tenderness at McBurney's point   Patient's presentation is most consistent with acute presentation with potential threat to life or bodily function.   PLAN: Will obtain labs, urine,  OB ultrasound with Doppler.  Will give Tylenol  and continue to monitor.   MEDICATIONS GIVEN IN ED: Medications  acetaminophen  (TYLENOL ) tablet 1,000 mg (1,000 mg Oral Given 10/11/24 0110)  metroNIDAZOLE  (FLAGYL ) tablet 500 mg (500 mg Oral Given 10/11/24 0232)     ED COURSE: Patient's labs show no leukocytosis.  Chronic and stable anemia.  She does have a bicarb of 16 and it appears she has had low bicarb as previously but normal blood glucose and anion gap.  Patient states she has been eating and drinking well without vomiting or diarrhea and does not appear dehydrated on exam.  Doubt DKA, lactic acidosis, severe dehydration.  Her urine today shows pyuria, red blood cells, bacteria but also many squamous cells.  Could be a contaminated sample but given she is pregnant and having lower abdominal pain, will treat with antibiotics.  Urine culture has been added on (spoke with [facility] in lab to have added on).  She also has clue cells and reports having odor but no itching, burning, discharge.  Again given she is pregnant we will treat with Flagyl .  Gonorrhea and chlamydia today are negative.  Ultrasound reviewed and interpreted by myself and the radiologist and  shows normal intrauterine twin pregnancy with normal fetal cardiac activity.  Normal blood flow to both ovaries.  On reevaluation patient states she is feeling better.  We discussed that this could be round ligament pain and she agrees stating she has had this previously.  She does have an OB/GYN for follow-up.  Low suspicion clinically for pyelonephritis, infected kidney stone, appendicitis but did discuss at length return precautions.  Patient comfortable with this plan.   At this time, I do not feel there is any life-threatening condition present. I reviewed all nursing notes, vitals, pertinent previous records.  All lab and urine results, EKGs, imaging ordered have been independently reviewed and interpreted by myself.  I reviewed all available radiology reports from any imaging ordered this visit.  Based on my assessment, I feel the patient is safe to be discharged home without further emergent workup and can continue workup as an outpatient as needed. Discussed all findings, treatment plan as well as usual and customary return precautions.  They verbalize understanding and are comfortable with this plan.  Outpatient follow-up has been provided as needed.  All questions have been answered.    CONSULTS:  none   OUTSIDE RECORDS REVIEWED:  Reviewed OBGYN note 10/03/24.       FINAL CLINICAL IMPRESSION(S) / ED DIAGNOSES   Final diagnoses:  Pelvic pain in pregnancy  Bacterial vaginosis  Acute UTI     Rx / DC Orders   ED Discharge Orders          Ordered    metroNIDAZOLE  (FLAGYL ) 500 MG tablet  2 times daily        10/11/24 0228             Note:  This document was prepared using Dragon voice recognition software and may include unintentional dictation errors.   Leman Martinek, Josette SAILOR, DO 10/11/24 [phone]  "

## 2024-10-11 NOTE — Discharge Instructions (Addendum)
 You may take Tylenol  1000 mg every 6 hours as needed for pain.  I recommend close follow-up with your OB/GYN if symptoms continue.  Please return the emergency department if you have worsening pain especially in the right lower abdomen, fevers of 100.4 or higher, vomiting does not stop, vaginal bleeding or leaking fluid.  Please take your antibiotics twice a day until complete.

## 2024-10-12 LAB — PANORAMA PRENATAL TEST FULL PANEL:PANORAMA TEST PLUS 5 ADDITIONAL MICRODELETIONS
FETAL FRACTION SECOND FETUS: 2.1
FETAL FRACTION: 2.3

## 2024-10-12 LAB — URINE CULTURE: Culture: <10000 — AB

## 2024-10-23 ENCOUNTER — Encounter: Admitting: Obstetrics & Gynecology

## 2024-12-04 ENCOUNTER — Other Ambulatory Visit

## 2024-12-04 ENCOUNTER — Ambulatory Visit
